# Patient Record
Sex: Male | Born: 1945
Health system: Southern US, Community
[De-identification: ages and names within clinical notes are randomized; demographics above are authoritative.]

---

## 2001-05-23 ENCOUNTER — Encounter: Admission: RE | Admit: 2001-05-23 | Discharge: 2001-05-23 | Payer: Self-pay | Admitting: Family Medicine

## 2001-05-23 ENCOUNTER — Encounter: Payer: Self-pay | Admitting: Family Medicine

## 2003-05-14 ENCOUNTER — Encounter: Admission: RE | Admit: 2003-05-14 | Discharge: 2003-05-14 | Payer: Self-pay | Admitting: Family Medicine

## 2003-11-29 ENCOUNTER — Encounter (INDEPENDENT_AMBULATORY_CARE_PROVIDER_SITE_OTHER): Payer: Self-pay | Admitting: *Deleted

## 2003-11-29 ENCOUNTER — Ambulatory Visit (HOSPITAL_COMMUNITY): Admission: RE | Admit: 2003-11-29 | Discharge: 2003-11-29 | Payer: Self-pay | Admitting: Urology

## 2004-01-31 ENCOUNTER — Ambulatory Visit: Payer: Self-pay | Admitting: Family Medicine

## 2004-08-09 ENCOUNTER — Ambulatory Visit: Payer: Self-pay | Admitting: Family Medicine

## 2004-08-15 ENCOUNTER — Ambulatory Visit (HOSPITAL_COMMUNITY): Admission: RE | Admit: 2004-08-15 | Discharge: 2004-08-15 | Payer: Self-pay | Admitting: Gastroenterology

## 2004-08-15 ENCOUNTER — Encounter (INDEPENDENT_AMBULATORY_CARE_PROVIDER_SITE_OTHER): Payer: Self-pay | Admitting: *Deleted

## 2004-09-13 ENCOUNTER — Ambulatory Visit: Payer: Self-pay | Admitting: Family Medicine

## 2004-09-20 ENCOUNTER — Ambulatory Visit: Payer: Self-pay | Admitting: Family Medicine

## 2005-04-23 ENCOUNTER — Ambulatory Visit: Payer: Self-pay | Admitting: Family Medicine

## 2006-11-28 DIAGNOSIS — Z87442 Personal history of urinary calculi: Secondary | ICD-10-CM | POA: Insufficient documentation

## 2008-04-20 ENCOUNTER — Encounter: Admission: RE | Admit: 2008-04-20 | Discharge: 2008-04-20 | Payer: Self-pay | Admitting: General Surgery

## 2008-04-22 ENCOUNTER — Ambulatory Visit (HOSPITAL_BASED_OUTPATIENT_CLINIC_OR_DEPARTMENT_OTHER): Admission: RE | Admit: 2008-04-22 | Discharge: 2008-04-22 | Payer: Self-pay | Admitting: General Surgery

## 2008-05-06 ENCOUNTER — Ambulatory Visit (HOSPITAL_BASED_OUTPATIENT_CLINIC_OR_DEPARTMENT_OTHER): Admission: RE | Admit: 2008-05-06 | Discharge: 2008-05-07 | Payer: Self-pay | Admitting: Orthopedic Surgery

## 2010-03-19 ENCOUNTER — Encounter: Payer: Self-pay | Admitting: Orthopedic Surgery

## 2010-05-18 ENCOUNTER — Other Ambulatory Visit: Payer: Self-pay | Admitting: Neurosurgery

## 2010-05-18 DIAGNOSIS — M542 Cervicalgia: Secondary | ICD-10-CM

## 2010-05-19 ENCOUNTER — Ambulatory Visit
Admission: RE | Admit: 2010-05-19 | Discharge: 2010-05-19 | Disposition: A | Discharge status: Home / Self Care | Payer: No Typology Code available for payment source | Source: Ambulatory Visit | Attending: Neurosurgery | Admitting: Neurosurgery

## 2010-05-19 DIAGNOSIS — M542 Cervicalgia: Secondary | ICD-10-CM

## 2010-06-13 LAB — COMPREHENSIVE METABOLIC PANEL
ALT: 15 U/L (ref 0–53)
AST: 20 U/L (ref 0–37)
Albumin: 4 g/dL (ref 3.5–5.2)
Alkaline Phosphatase: 49 U/L (ref 39–117)
Calcium: 9.6 mg/dL (ref 8.4–10.5)
GFR calc Af Amer: >60 mL/min (ref >60)
Glucose, Bld: 122 mg/dL — ABNORMAL HIGH (ref 70–99)
Potassium: 4 mEq/L (ref 3.5–5.1)
Sodium: 139 mEq/L (ref 135–145)
Total Protein: 6.5 g/dL (ref 6.0–8.3)

## 2010-06-13 LAB — CBC
HCT: 46.1 % (ref 39.0–52.0)
Hemoglobin: 16.3 g/dL (ref 13.0–17.0)
MCHC: 35.5 g/dL (ref 30.0–36.0)
MCV: 90.9 fL (ref 78.0–100.0)
Platelets: 188 10*3/uL (ref 150–400)
RBC: 5.07 MIL/uL (ref 4.22–5.81)
RDW: 12.7 % (ref 11.5–15.5)
WBC: 5.5 10*3/uL (ref 4.0–10.5)

## 2010-06-13 LAB — DIFFERENTIAL
Basophils Relative: 0 % (ref 0–1)
Eosinophils Absolute: 0.1 10*3/uL (ref 0.0–0.7)
Eosinophils Relative: 1 % (ref 0–5)
Lymphs Abs: 1.7 10*3/uL (ref 0.7–4.0)
Monocytes Absolute: 0.4 10*3/uL (ref 0.1–1.0)
Monocytes Relative: 6 % (ref 3–12)

## 2010-07-11 NOTE — Op Note (Signed)
NAMEJANIEL, CRISOSTOMO NO.:  [id number]   MEDICAL RECORD NO.:  [id number]          PATIENT TYPE:  AMB   LOCATION:  DSC                          FACILITY:  [facility]   PHYSICIAN:  Juanetta Gosling, MDDATE OF BIRTH:  09-28-1945   DATE OF PROCEDURE:  04/22/2008  DATE OF DISCHARGE:                               OPERATIVE REPORT   PREOPERATIVE DIAGNOSIS:  Recurrent right inguinal hernia.   POSTOPERATIVE DIAGNOSIS:  Indirect right inguinal hernia.   PROCEDURE:  Open right inguinal hernia with Ultrapro mesh patch.   SURGEON:  Troy Sine. Dwain Sarna, MD   ASSISTANT:  None.   ANESTHESIA:  General.   FINDINGS:  Indirect right inguinal hernia.   SPECIMENS:  None.   DRAINS:  None.   COMPLICATIONS:  None.   ESTIMATED BLOOD LOSS:  Minimal.   DISPOSITION:  To recovery room in stable condition.   HISTORY:  Mr. Arko is a 65 year old male with about a 81-month history  of right groin pain and a bulge.  This has been increasingly painful to  him and has been popping out quite frequently.  This has been reducible  at all times, but it is certainly getting worse over time on his exam.  On exam he had a healed right groin incision from a pediatric right  inguinal hernia repair as well as a reducible right inguinal that was  nontender. He and I discussed open right inguinal hernia repair with  mesh.   PROCEDURE:  After informed consent was obtained, the patient was taken  to the operating room.  He had 1 g of intravenous cefazolin  administered.  Sequential compression devices were placed on his lower  extremities prior to operation.  He was placed under general anesthesia  without complication.  His right groin was then prepped and draped in a  standard sterile surgical fashion.  A surgical time-out was then  performed.   His prior right groin incision was then entered.  Dissection was carried  out down with electrocautery to the level of external abdominal oblique.  There was some scarring to his abdominal oblique requiring this to be  mobilized from his skin.  I then entered his external abdominal oblique  through the external ring, which had been mostly obliterated with his  prior hernia repair.  This space was then developed.  The spermatic cord  was then identified and encircled with a Penrose drain.  He had a fairly  sizeable indirect hernia that was dissected free from the surrounding  cord structures.  I then excised the hernia sac and reduced this into  the abdomen.  After creating most space, an Ultrapro mesh patch was then  laid over the floor.  This was sutured into position with a 2-0 Prolene  at the pubic tubercle, but not into pubic tubercle, in 2 positions.  This was sutured at the inguinal ligament in 2 positions as well as to  the internal abdominal oblique in 2 positions.  A T-cut was made in the  mesh and the mesh was wrapped around the spermatic cord.  These ends  were  then tacked  together as well as to the shelving edge with the 2-0  Prolene.  The ends were then laid flat underneath the external abdominal  oblique.  The mesh appeared in good position.  Hemostasis was observed.  The Penrose drain was removed.  The external abdominal oblique was then  closed with a 2-0 Vicryl.  The soft tissue was then closed with another  2-0 Vicryl.  The Scarpa's was closed with a 3-0 Vicryl and the skin was  closed with 4-0 Monocryl in a subcuticular fashion.  Steri-Strips and  sterile dressing were placed over this.  A 10 mL of 0.25% Marcaine  infiltrated around the wound as well as performing an ilioinguinal nerve  block.  Sterile dressing was then placed overlying this.  He tolerated  this well and was transferred to the recovery room in stable condition.      Juanetta Gosling, MD  Electronically Signed     MCW/MEDQ  D:  04/22/2008  T:  04/23/2008  Job:  [id number]   cc:   Lynelle Smoke I. Patsi Sears, M.D.

## 2010-07-11 NOTE — Op Note (Signed)
Sean Norris, Sean Norris               ACCOUNT NO.:  [id number]   MEDICAL RECORD NO.:  [id number]          PATIENT TYPE:  AMB   LOCATION:  DSC                          FACILITY:  [facility]   PHYSICIAN:  Katy Fitch. Sypher, M.D. DATE OF BIRTH:  Jan 11, 1946   DATE OF PROCEDURE:  05/06/2008  DATE OF DISCHARGE:                               OPERATIVE REPORT   PREOPERATIVE DIAGNOSES:  Chronic stage-III impingement, right shoulder  with MRI evidence of a partial-thickness articular side rotator cuff  tear, biceps tendinopathy, labral pathology, and unfavorable  acromioclavicular anatomy with a prominent medial acromion.   POSTOPERATIVE DIAGNOSES:  A 25% biceps tear at entrance to  intertubercular groove, chronic synovitis, chronic type-I  degenerative/labral degenerative tear, acromioclavicular degenerative  arthritis with prominent inferior projecting osteophytes and an 85%  bursal surface tear of the supraspinatus that was retracted  approximately 2 cm.   OPERATIONS:  1. Diagnostic arthroscopy, right shoulder.  2. Arthroscopic debridement of biceps, labrum, and synovitis.  3. Arthroscopic debridement of deep surface of rotator cuff primarily      supraspinatus.  4. Subacromial decompression with bursectomy, coracoacromial ligament      relaxation and bursectomy.  5. Arthroscopic distal clavicle resection.  6. Tuberosity plasty followed by arthroscopic repair of supraspinatus      tendon utilizing a Arthrex fiber tape and a swivel lock laterally.   OPERATING SURGEON:  Katy Fitch. Sypher, MD   ASSISTANT:  Marveen Reeks Dasnoit, PA-C   ANESTHESIA:  General endotracheal supplemented by a right interscalene  block.   SUPERVISING ANESTHESIOLOGIST:  Maren Beach, MD   INDICATIONS:  Sean Norris is a 65 year old gentleman referred through  the courtesy of Dr. Maurice Small and Dr. Pati Gallo of [facility] for evaluation and management of a painful right shoulder.  Clinical  examination suggested a degenerative rotator cuff tear with AC  arthropathy.  He likely has stage-III impingement.   We recommended that he consider diagnostic arthroscopy, subacromial  decompression, rotator cuff debridement, debridement as arthroscopy  findings dictated and we suggested preoperatively that he had a  significant chance of requiring rotator cuff reconstruction.   Preoperative questions were invited and answered in detail both in the  office and in the holding area.   Sean Norris is brought to the operating room at this time anticipating  arthroscopic decompression, debridement, and possible repair of the  rotator cuff.   PROCEDURE:  Sean Norris was brought to the operating room and placed  in supine position on the operating table.  Following an anesthesia  consult with Dr. Katrinka Blazing in the holding area, general anesthesia by  endotracheal technique was recommended and accepted.  Dr. Katrinka Blazing also  provided informed consent and placed a right interscalene block without  complication.   Sean Norris was brought to room #8 of the [facility], placed in  supine position on the operating table.  Under Dr. Michaelle Copas direct  supervision, general endotracheal anesthesia induced.   He was carefully positioned in beach-chair position with aid of a torso  and head holder designed for arthroscopy.  A 1 g  of Ancef was  administered as an IV prophylactic antibiotic.   The entire right extremity forequarter prepped with DuraPrep and draped  with impervious arthroscopy drapes.  The scope was introduced through a  standard posterior viewing portal with an anterior switching stick.  Diagnostic arthroscopy revealed grade II and III chondromalacia of the  humeral head anteriorly.  There was a 25% biceps tear that was  degenerative at the intertubercular groove.  The labrum had degenerative  type I changes.  An anterior portal was created under direct vision  followed by use of  suction shaver to debride the biceps, labrum, and  synovitis.  Deep surface of the cuff was debrided and no full-thickness  tear was identified.  The scope was then placed in subacromial space.  There was florid bursitis.  After bursectomy, the coracoacromial  ligament was partially relaxed.  The acromion was leveled to a type 1  morphology and complete bursectomy was accomplished.  The [facility] joint  capsule was violated.  There were significant inferior projecting  osteophytes from medial acromion and distal clavicle.  The distal 15-mm  of the clavicle was removed arthroscopically with hemostasis being  achieved with bipolar cautery.  The infraspinatus tendon was intact.  Teres minor was intact.  Subscapularis tendon had minimal degenerative  changes, grade I.  The bursal side of the supraspinatus had a  degenerative ulcerated tear that was about 85% of the thickness of the  tendon and retracted about 15 mm.   A suction bur was used to decorticate the exposed tuberosity and perform  a tuberosity plasty lowering the profile of the tuberosity about 3 mm.   An arthroscopic periosteal elevator was used to create tracks to the  supraspinatus tendon and a Scorpion suture passer was used to pass a  mattress suture of fiber tape, which allowed creation of a horizontal  mattress suture to a lateral swivel lock anatomically replacing the  supraspinatus footprint.  Satisfactory contour was achieved.   The subacromial space was then inspected for hemostasis followed by  removal of the arthroscopic equipment.  Photographic documentation of  chondromalacia, labral degenerative changes, biceps tear, and rotator  cuff tear was accomplished with a digital camera.   There were no apparent complications.   Sean Norris tolerated the surgery and anesthesia well.  He was placed in  a sling, transferred to the recovery room with stable signs.  The  portals were repaired with 3-0 Prolene.  For aftercare, he  will be  admitted to recovery care center for observation of his vital signs and  appropriate analgesics from p.o. and IV Dilaudid and p.o. Percocet.  He  will also be provided Ancef 1 g IV q.8 h. x3 doses.      Katy Fitch Sypher, M.D.  Electronically Signed     RVS/MEDQ  D:  05/06/2008  T:  05/07/2008  Job:  [id number]   cc:   Gretta Arab. Valentina Lucks, M.D.  Estell Harpin, M.D.

## 2010-07-14 NOTE — Op Note (Signed)
NAMENAZAIR, FORTENBERRY               ACCOUNT NO.:  [id number]   MEDICAL RECORD NO.:  [id number]          PATIENT TYPE:  AMB   LOCATION:  ENDO                         FACILITY:  [facility]   PHYSICIAN:  Petra Kuba, M.D.    DATE OF BIRTH:  1945-09-02   DATE OF PROCEDURE:  08/15/2004  DATE OF DISCHARGE:                                 OPERATIVE REPORT   PROCEDURE:  Colonoscopy with biopsy.   INDICATION:  Bright red blood per rectum, family history of colon polyps,  due for colonic screening. Consent was signed after risks, benefits,  methods, options were thoroughly discussed in the office.   MEDICINES USED:  Demerol 80, Versed 8.   PROCEDURE:  Rectal inspection pertinent for external hemorrhoids, small.  Digital exam was negative. Video pediatric adjustable colonoscope was  inserted, easily advanced around the colon to the cecum. No position changes  or abdominal pressure was needed.  The cecum was identified by the  appendiceal orifice and ileocecal valve. In fact, the scope was inserted a  short ways in the terminal ileum which was normal.  Photodocumentation was  obtained. Scope was slowly withdrawn. The prep was adequate. There is some  liquid stool that required washing and suctioning on slow withdrawal through  the colon cecum. The cecum, ascending, transverse, descending and sigmoid  were all normal without signs of diverticula, polyps or masses. Once in the  rectum two tiny probable hyperplastic appearing polyps were seen.  These  were cold biopsied times one and put in the same container. Anorectal pull-  through and retroflexion confirmed some small hemorrhoids. Scope was  straightened and readvanced short ways up the left side of the colon.  Air  was suctioned, scope removed. The patient tolerated the procedure well.  There was no obvious immediate complication.   ENDOSCOPIC DIAGNOSES:  1.  Internal-external hemorrhoids.  2.  Two tiny rectal polyps cold biopsied. Probably  hyperplastic.  3.  Otherwise within normal limits to the terminal ileum.   PLAN:  Await pathology. Probably recheck colon screening in five years.  Happy to see back p.r.n. otherwise return care to Dr. Clent Ridges for the customary  health care maintenance to include yearly rectals and guaiacs.       MEM/MEDQ  D:  08/15/2004  T:  08/15/2004  Job:  [id number]   cc:   Jeannett Senior A. Clent Ridges, M.D. [facility]

## 2010-07-14 NOTE — Op Note (Signed)
NAME:  Sean Norris, Sean Norris               ACCOUNT NO.:  [id number]   MEDICAL RECORD NO.:  [id number]          PATIENT TYPE:  AMB   LOCATION:  DAY                          [facility]ACILITY:  [facility]   PHYSICIAN:  Sigmund I. Tannenbaum, M.D.DATE OF BIRTH:  [birth year]   DATE OF PROCEDURE:  11/29/2003  DATE OF DISCHARGE:                                 OPERATIVE REPORT   PREOPERATIVE DIAGNOSIS:  Large left epididymal cyst.   POSTOPERATIVE DIAGNOSIS:  Large left epididymal cyst.   OPERATION:  Scrotal exploration and extrication of large left epididymal  cyst ( 8 cm x 8 cm).   SURGEON:  Sigmund I. Patsi Sears, M.D.   ASSISTANT:  Terri Piedra, N.P.   PREPARATION:  After appropriate preanesthesia, the patient is brought to the  operating room and placed on the operating room table in the dorsal supine  position.  There general LMA anesthesia was introduced.  He remained in the  supine position, where the scrotum was prepped with Betadine solution and  draped in the usual fashion.   DESCRIPTION OF PROCEDURE:  Left scrotal incision was made and subcutaneous  tissue was dissected with electrosurgery.  A very large, left cystic mass  was identified attached to the testicle.  Careful sharp and blunt dissection  was accomplished.  Both the testicle and the large cyst were delivered into  the wound.  Careful dissection of both anterior, posteriorly, superiorly and  inferiorly was accomplished, and the cyst was dissected free from the  stretched spermatic vessel.  The vas was identified, the spermatic vessels  were identified and left intact.   The cyst was removed intact, and sent to the laboratory for evaluation.  No  bleeding was noted.  The testicle was delivered in the wound.  A cord block  was effected with 0.5 plain Marcaine, and the wound was also anesthetized  with 0.5 plain Marcaine.  The wound was then closed in three layers with 3-0  Vicryl suture.  The patient tolerated the procedure well, and was  awakened  and taken to the recovery room in good condition.      SIT/MEDQ  D:  11/29/2003  T:  11/29/2003  Job:  [id number]

## 2010-11-09 ENCOUNTER — Other Ambulatory Visit (HOSPITAL_COMMUNITY): Payer: Self-pay | Admitting: Neurosurgery

## 2010-11-09 DIAGNOSIS — M5412 Radiculopathy, cervical region: Secondary | ICD-10-CM

## 2010-12-12 ENCOUNTER — Other Ambulatory Visit (HOSPITAL_COMMUNITY): Payer: No Typology Code available for payment source

## 2011-01-30 ENCOUNTER — Other Ambulatory Visit (HOSPITAL_COMMUNITY): Payer: No Typology Code available for payment source

## 2011-09-06 DIAGNOSIS — R634 Abnormal weight loss: Secondary | ICD-10-CM | POA: Diagnosis not present

## 2011-09-06 DIAGNOSIS — Z1211 Encounter for screening for malignant neoplasm of colon: Secondary | ICD-10-CM | POA: Diagnosis not present

## 2011-09-06 DIAGNOSIS — K625 Hemorrhage of anus and rectum: Secondary | ICD-10-CM | POA: Diagnosis not present

## 2011-09-06 DIAGNOSIS — R0989 Other specified symptoms and signs involving the circulatory and respiratory systems: Secondary | ICD-10-CM | POA: Diagnosis not present

## 2011-09-06 DIAGNOSIS — R5381 Other malaise: Secondary | ICD-10-CM | POA: Diagnosis not present

## 2011-09-06 DIAGNOSIS — R5383 Other fatigue: Secondary | ICD-10-CM | POA: Diagnosis not present

## 2011-09-10 DIAGNOSIS — R634 Abnormal weight loss: Secondary | ICD-10-CM | POA: Diagnosis not present

## 2011-09-10 DIAGNOSIS — K921 Melena: Secondary | ICD-10-CM | POA: Diagnosis not present

## 2011-09-10 DIAGNOSIS — Z1211 Encounter for screening for malignant neoplasm of colon: Secondary | ICD-10-CM | POA: Diagnosis not present

## 2011-09-18 DIAGNOSIS — L570 Actinic keratosis: Secondary | ICD-10-CM | POA: Diagnosis not present

## 2011-10-19 ENCOUNTER — Other Ambulatory Visit: Payer: Self-pay | Admitting: Gastroenterology

## 2011-10-19 DIAGNOSIS — K921 Melena: Secondary | ICD-10-CM | POA: Diagnosis not present

## 2011-10-19 DIAGNOSIS — K5730 Diverticulosis of large intestine without perforation or abscess without bleeding: Secondary | ICD-10-CM | POA: Diagnosis not present

## 2011-10-19 DIAGNOSIS — D129 Benign neoplasm of anus and anal canal: Secondary | ICD-10-CM | POA: Diagnosis not present

## 2011-10-19 DIAGNOSIS — D126 Benign neoplasm of colon, unspecified: Secondary | ICD-10-CM | POA: Diagnosis not present

## 2011-10-19 DIAGNOSIS — R634 Abnormal weight loss: Secondary | ICD-10-CM | POA: Diagnosis not present

## 2011-10-19 DIAGNOSIS — D128 Benign neoplasm of rectum: Secondary | ICD-10-CM | POA: Diagnosis not present

## 2012-02-08 DIAGNOSIS — Z23 Encounter for immunization: Secondary | ICD-10-CM | POA: Diagnosis not present

## 2012-03-03 DIAGNOSIS — H02139 Senile ectropion of unspecified eye, unspecified eyelid: Secondary | ICD-10-CM | POA: Diagnosis not present

## 2012-04-01 ENCOUNTER — Other Ambulatory Visit: Payer: Self-pay | Admitting: Dermatology

## 2012-04-01 DIAGNOSIS — D485 Neoplasm of uncertain behavior of skin: Secondary | ICD-10-CM | POA: Diagnosis not present

## 2012-04-01 DIAGNOSIS — D044 Carcinoma in situ of skin of scalp and neck: Secondary | ICD-10-CM | POA: Diagnosis not present

## 2012-04-01 DIAGNOSIS — Z85828 Personal history of other malignant neoplasm of skin: Secondary | ICD-10-CM | POA: Diagnosis not present

## 2012-04-01 DIAGNOSIS — L570 Actinic keratosis: Secondary | ICD-10-CM | POA: Diagnosis not present

## 2012-04-08 DIAGNOSIS — H2510 Age-related nuclear cataract, unspecified eye: Secondary | ICD-10-CM | POA: Diagnosis not present

## 2012-05-13 DIAGNOSIS — H2510 Age-related nuclear cataract, unspecified eye: Secondary | ICD-10-CM | POA: Diagnosis not present

## 2012-05-19 DIAGNOSIS — H02839 Dermatochalasis of unspecified eye, unspecified eyelid: Secondary | ICD-10-CM | POA: Diagnosis not present

## 2012-05-19 DIAGNOSIS — H11439 Conjunctival hyperemia, unspecified eye: Secondary | ICD-10-CM | POA: Diagnosis not present

## 2012-05-19 DIAGNOSIS — H04229 Epiphora due to insufficient drainage, unspecified lacrimal gland: Secondary | ICD-10-CM | POA: Diagnosis not present

## 2012-05-19 DIAGNOSIS — M2420 Disorder of ligament, unspecified site: Secondary | ICD-10-CM | POA: Diagnosis not present

## 2012-05-19 DIAGNOSIS — H02139 Senile ectropion of unspecified eye, unspecified eyelid: Secondary | ICD-10-CM | POA: Diagnosis not present

## 2012-05-19 DIAGNOSIS — H02429 Myogenic ptosis of unspecified eyelid: Secondary | ICD-10-CM | POA: Diagnosis not present

## 2012-06-23 DIAGNOSIS — M2420 Disorder of ligament, unspecified site: Secondary | ICD-10-CM | POA: Diagnosis not present

## 2012-06-23 DIAGNOSIS — H04529 Eversion of unspecified lacrimal punctum: Secondary | ICD-10-CM | POA: Diagnosis not present

## 2012-06-23 DIAGNOSIS — H02139 Senile ectropion of unspecified eye, unspecified eyelid: Secondary | ICD-10-CM | POA: Diagnosis not present

## 2012-06-23 DIAGNOSIS — H11439 Conjunctival hyperemia, unspecified eye: Secondary | ICD-10-CM | POA: Diagnosis not present

## 2012-06-23 DIAGNOSIS — H04229 Epiphora due to insufficient drainage, unspecified lacrimal gland: Secondary | ICD-10-CM | POA: Diagnosis not present

## 2012-07-07 DIAGNOSIS — H52209 Unspecified astigmatism, unspecified eye: Secondary | ICD-10-CM | POA: Diagnosis not present

## 2012-07-07 DIAGNOSIS — H2510 Age-related nuclear cataract, unspecified eye: Secondary | ICD-10-CM | POA: Diagnosis not present

## 2012-07-07 DIAGNOSIS — H269 Unspecified cataract: Secondary | ICD-10-CM | POA: Diagnosis not present

## 2012-07-08 DIAGNOSIS — H2510 Age-related nuclear cataract, unspecified eye: Secondary | ICD-10-CM | POA: Diagnosis not present

## 2012-07-28 DIAGNOSIS — H11009 Unspecified pterygium of unspecified eye: Secondary | ICD-10-CM | POA: Diagnosis not present

## 2012-07-28 DIAGNOSIS — H52209 Unspecified astigmatism, unspecified eye: Secondary | ICD-10-CM | POA: Diagnosis not present

## 2012-07-28 DIAGNOSIS — H2510 Age-related nuclear cataract, unspecified eye: Secondary | ICD-10-CM | POA: Diagnosis not present

## 2012-07-28 DIAGNOSIS — H269 Unspecified cataract: Secondary | ICD-10-CM | POA: Diagnosis not present

## 2012-07-29 ENCOUNTER — Other Ambulatory Visit: Payer: Self-pay | Admitting: Dermatology

## 2012-07-29 DIAGNOSIS — D485 Neoplasm of uncertain behavior of skin: Secondary | ICD-10-CM | POA: Diagnosis not present

## 2012-07-29 DIAGNOSIS — L723 Sebaceous cyst: Secondary | ICD-10-CM | POA: Diagnosis not present

## 2012-07-29 DIAGNOSIS — L7211 Pilar cyst: Secondary | ICD-10-CM | POA: Diagnosis not present

## 2012-07-29 DIAGNOSIS — Z85828 Personal history of other malignant neoplasm of skin: Secondary | ICD-10-CM | POA: Diagnosis not present

## 2012-07-29 DIAGNOSIS — L570 Actinic keratosis: Secondary | ICD-10-CM | POA: Diagnosis not present

## 2012-08-05 DIAGNOSIS — L080 Pyoderma: Secondary | ICD-10-CM | POA: Diagnosis not present

## 2012-09-15 DIAGNOSIS — H11439 Conjunctival hyperemia, unspecified eye: Secondary | ICD-10-CM | POA: Diagnosis not present

## 2012-09-15 DIAGNOSIS — H02139 Senile ectropion of unspecified eye, unspecified eyelid: Secondary | ICD-10-CM | POA: Diagnosis not present

## 2012-09-15 DIAGNOSIS — H02419 Mechanical ptosis of unspecified eyelid: Secondary | ICD-10-CM | POA: Diagnosis not present

## 2012-09-15 DIAGNOSIS — M2420 Disorder of ligament, unspecified site: Secondary | ICD-10-CM | POA: Diagnosis not present

## 2012-11-01 DIAGNOSIS — S0100XA Unspecified open wound of scalp, initial encounter: Secondary | ICD-10-CM | POA: Diagnosis not present

## 2012-11-12 ENCOUNTER — Other Ambulatory Visit: Payer: Self-pay | Admitting: Dermatology

## 2012-11-12 DIAGNOSIS — D485 Neoplasm of uncertain behavior of skin: Secondary | ICD-10-CM | POA: Diagnosis not present

## 2012-11-12 DIAGNOSIS — C4359 Malignant melanoma of other part of trunk: Secondary | ICD-10-CM | POA: Diagnosis not present

## 2012-11-12 DIAGNOSIS — L570 Actinic keratosis: Secondary | ICD-10-CM | POA: Diagnosis not present

## 2012-11-12 DIAGNOSIS — Z85828 Personal history of other malignant neoplasm of skin: Secondary | ICD-10-CM | POA: Diagnosis not present

## 2012-11-12 DIAGNOSIS — C44319 Basal cell carcinoma of skin of other parts of face: Secondary | ICD-10-CM | POA: Diagnosis not present

## 2012-11-20 ENCOUNTER — Other Ambulatory Visit: Payer: Self-pay | Admitting: Dermatology

## 2012-11-20 DIAGNOSIS — Z85828 Personal history of other malignant neoplasm of skin: Secondary | ICD-10-CM | POA: Diagnosis not present

## 2012-11-20 DIAGNOSIS — C4359 Malignant melanoma of other part of trunk: Secondary | ICD-10-CM | POA: Diagnosis not present

## 2012-12-12 DIAGNOSIS — Z23 Encounter for immunization: Secondary | ICD-10-CM | POA: Diagnosis not present

## 2012-12-22 ENCOUNTER — Other Ambulatory Visit: Payer: Self-pay | Admitting: Ophthalmology

## 2012-12-22 DIAGNOSIS — H02109 Unspecified ectropion of unspecified eye, unspecified eyelid: Secondary | ICD-10-CM | POA: Diagnosis not present

## 2012-12-22 DIAGNOSIS — L923 Foreign body granuloma of the skin and subcutaneous tissue: Secondary | ICD-10-CM | POA: Diagnosis not present

## 2012-12-22 DIAGNOSIS — D210 Benign neoplasm of connective and other soft tissue of head, face and neck: Secondary | ICD-10-CM | POA: Diagnosis not present

## 2012-12-22 DIAGNOSIS — H0289 Other specified disorders of eyelid: Secondary | ICD-10-CM | POA: Diagnosis not present

## 2012-12-22 DIAGNOSIS — M2420 Disorder of ligament, unspecified site: Secondary | ICD-10-CM | POA: Diagnosis not present

## 2012-12-22 DIAGNOSIS — H02139 Senile ectropion of unspecified eye, unspecified eyelid: Secondary | ICD-10-CM | POA: Diagnosis not present

## 2013-04-22 ENCOUNTER — Other Ambulatory Visit: Payer: Self-pay | Admitting: Dermatology

## 2013-04-22 DIAGNOSIS — L570 Actinic keratosis: Secondary | ICD-10-CM | POA: Diagnosis not present

## 2013-04-22 DIAGNOSIS — D485 Neoplasm of uncertain behavior of skin: Secondary | ICD-10-CM | POA: Diagnosis not present

## 2013-04-22 DIAGNOSIS — L578 Other skin changes due to chronic exposure to nonionizing radiation: Secondary | ICD-10-CM | POA: Diagnosis not present

## 2013-04-22 DIAGNOSIS — C44621 Squamous cell carcinoma of skin of unspecified upper limb, including shoulder: Secondary | ICD-10-CM | POA: Diagnosis not present

## 2013-04-22 DIAGNOSIS — L821 Other seborrheic keratosis: Secondary | ICD-10-CM | POA: Diagnosis not present

## 2013-04-22 DIAGNOSIS — Z85828 Personal history of other malignant neoplasm of skin: Secondary | ICD-10-CM | POA: Diagnosis not present

## 2013-04-22 DIAGNOSIS — Z85820 Personal history of malignant melanoma of skin: Secondary | ICD-10-CM | POA: Diagnosis not present

## 2013-05-04 DIAGNOSIS — H02839 Dermatochalasis of unspecified eye, unspecified eyelid: Secondary | ICD-10-CM | POA: Diagnosis not present

## 2013-05-04 DIAGNOSIS — H02419 Mechanical ptosis of unspecified eyelid: Secondary | ICD-10-CM | POA: Diagnosis not present

## 2013-05-04 DIAGNOSIS — H02409 Unspecified ptosis of unspecified eyelid: Secondary | ICD-10-CM | POA: Diagnosis not present

## 2013-08-03 DIAGNOSIS — H11009 Unspecified pterygium of unspecified eye: Secondary | ICD-10-CM | POA: Diagnosis not present

## 2013-08-03 DIAGNOSIS — H5200 Hypermetropia, unspecified eye: Secondary | ICD-10-CM | POA: Diagnosis not present

## 2013-11-05 DIAGNOSIS — Z23 Encounter for immunization: Secondary | ICD-10-CM | POA: Diagnosis not present

## 2013-11-25 DIAGNOSIS — C4491 Basal cell carcinoma of skin, unspecified: Secondary | ICD-10-CM | POA: Diagnosis not present

## 2013-11-25 DIAGNOSIS — Z131 Encounter for screening for diabetes mellitus: Secondary | ICD-10-CM | POA: Diagnosis not present

## 2013-11-25 DIAGNOSIS — Z23 Encounter for immunization: Secondary | ICD-10-CM | POA: Diagnosis not present

## 2013-11-25 DIAGNOSIS — C439 Malignant melanoma of skin, unspecified: Secondary | ICD-10-CM | POA: Diagnosis not present

## 2013-11-25 DIAGNOSIS — Z0000 Encounter for general adult medical examination without abnormal findings: Secondary | ICD-10-CM | POA: Diagnosis not present

## 2013-11-25 DIAGNOSIS — R030 Elevated blood-pressure reading, without diagnosis of hypertension: Secondary | ICD-10-CM | POA: Diagnosis not present

## 2013-11-25 DIAGNOSIS — Z136 Encounter for screening for cardiovascular disorders: Secondary | ICD-10-CM | POA: Diagnosis not present

## 2013-11-25 DIAGNOSIS — Z125 Encounter for screening for malignant neoplasm of prostate: Secondary | ICD-10-CM | POA: Diagnosis not present

## 2013-12-17 ENCOUNTER — Other Ambulatory Visit: Payer: Self-pay | Admitting: Dermatology

## 2013-12-17 DIAGNOSIS — Z85828 Personal history of other malignant neoplasm of skin: Secondary | ICD-10-CM | POA: Diagnosis not present

## 2013-12-17 DIAGNOSIS — L821 Other seborrheic keratosis: Secondary | ICD-10-CM | POA: Diagnosis not present

## 2013-12-17 DIAGNOSIS — D044 Carcinoma in situ of skin of scalp and neck: Secondary | ICD-10-CM | POA: Diagnosis not present

## 2013-12-17 DIAGNOSIS — C44622 Squamous cell carcinoma of skin of right upper limb, including shoulder: Secondary | ICD-10-CM | POA: Diagnosis not present

## 2013-12-17 DIAGNOSIS — D485 Neoplasm of uncertain behavior of skin: Secondary | ICD-10-CM | POA: Diagnosis not present

## 2013-12-17 DIAGNOSIS — L570 Actinic keratosis: Secondary | ICD-10-CM | POA: Diagnosis not present

## 2013-12-17 DIAGNOSIS — Z85820 Personal history of malignant melanoma of skin: Secondary | ICD-10-CM | POA: Diagnosis not present

## 2014-06-22 ENCOUNTER — Other Ambulatory Visit: Payer: Self-pay | Admitting: Dermatology

## 2015-06-20 DIAGNOSIS — Z125 Encounter for screening for malignant neoplasm of prostate: Secondary | ICD-10-CM | POA: Diagnosis not present

## 2015-06-20 DIAGNOSIS — E7800 Pure hypercholesterolemia, unspecified: Secondary | ICD-10-CM | POA: Diagnosis not present

## 2015-06-28 DIAGNOSIS — H524 Presbyopia: Secondary | ICD-10-CM | POA: Diagnosis not present

## 2015-06-29 DIAGNOSIS — Z85828 Personal history of other malignant neoplasm of skin: Secondary | ICD-10-CM | POA: Diagnosis not present

## 2015-06-29 DIAGNOSIS — L570 Actinic keratosis: Secondary | ICD-10-CM | POA: Diagnosis not present

## 2015-06-29 DIAGNOSIS — Z85820 Personal history of malignant melanoma of skin: Secondary | ICD-10-CM | POA: Diagnosis not present

## 2015-06-29 DIAGNOSIS — L821 Other seborrheic keratosis: Secondary | ICD-10-CM | POA: Diagnosis not present

## 2015-08-22 DIAGNOSIS — R0981 Nasal congestion: Secondary | ICD-10-CM | POA: Diagnosis not present

## 2015-08-22 DIAGNOSIS — B349 Viral infection, unspecified: Secondary | ICD-10-CM | POA: Diagnosis not present

## 2015-12-14 DIAGNOSIS — R251 Tremor, unspecified: Secondary | ICD-10-CM | POA: Diagnosis not present

## 2015-12-14 DIAGNOSIS — Z8042 Family history of malignant neoplasm of prostate: Secondary | ICD-10-CM | POA: Diagnosis not present

## 2015-12-14 DIAGNOSIS — C4491 Basal cell carcinoma of skin, unspecified: Secondary | ICD-10-CM | POA: Diagnosis not present

## 2015-12-14 DIAGNOSIS — Z125 Encounter for screening for malignant neoplasm of prostate: Secondary | ICD-10-CM | POA: Diagnosis not present

## 2015-12-14 DIAGNOSIS — E7800 Pure hypercholesterolemia, unspecified: Secondary | ICD-10-CM | POA: Diagnosis not present

## 2015-12-14 DIAGNOSIS — G479 Sleep disorder, unspecified: Secondary | ICD-10-CM | POA: Diagnosis not present

## 2015-12-14 DIAGNOSIS — Z0000 Encounter for general adult medical examination without abnormal findings: Secondary | ICD-10-CM | POA: Diagnosis not present

## 2015-12-14 DIAGNOSIS — M1990 Unspecified osteoarthritis, unspecified site: Secondary | ICD-10-CM | POA: Diagnosis not present

## 2016-01-03 DIAGNOSIS — Z85820 Personal history of malignant melanoma of skin: Secondary | ICD-10-CM | POA: Diagnosis not present

## 2016-01-03 DIAGNOSIS — L570 Actinic keratosis: Secondary | ICD-10-CM | POA: Diagnosis not present

## 2016-01-03 DIAGNOSIS — Z85828 Personal history of other malignant neoplasm of skin: Secondary | ICD-10-CM | POA: Diagnosis not present

## 2016-01-03 DIAGNOSIS — L821 Other seborrheic keratosis: Secondary | ICD-10-CM | POA: Diagnosis not present

## 2016-01-09 ENCOUNTER — Ambulatory Visit (INDEPENDENT_AMBULATORY_CARE_PROVIDER_SITE_OTHER): Payer: PPO

## 2016-01-09 ENCOUNTER — Ambulatory Visit (INDEPENDENT_AMBULATORY_CARE_PROVIDER_SITE_OTHER): Payer: PPO | Admitting: Orthopaedic Surgery

## 2016-01-09 DIAGNOSIS — M25562 Pain in left knee: Secondary | ICD-10-CM | POA: Diagnosis not present

## 2016-01-09 DIAGNOSIS — G8929 Other chronic pain: Secondary | ICD-10-CM

## 2016-01-09 DIAGNOSIS — M25551 Pain in right hip: Secondary | ICD-10-CM

## 2016-01-09 MED ORDER — MELOXICAM 7.5 MG PO TABS: 7.5000 mg | ORAL_TABLET | Freq: Two times a day (BID) | ORAL | Status: AC | PRN

## 2016-01-09 MED ORDER — METHYLPREDNISOLONE 4 MG PO TABS: ORAL_TABLET | ORAL | 0 refills | Status: AC

## 2016-01-09 NOTE — Progress Notes (Signed)
Office Visit Note   Patient: Sean Norris           Date of Birth: December 19, 1945           MRN: [id number] Visit Date: 01/09/2016              Requested by: Kelton Pillar, MD [address] [place], [place] [address] PCP: Osborne Casco, MD   Assessment & Plan: Visit Diagnoses:  1. Pain in right hip   2. Chronic pain of left knee     Plan: I did show Sean Norris quad strengthening exercises to try. We'll put him on a six-day steroid taper followed by meloxicam. I'll see him back in a month to see how is doing overall. If he still having the mechanical symptoms in his left knee of locking and catching incident MRI of that knee. We may even consider injections over his right hip trochanteric area for trochanter bursitis and in his left elbow joint.  Follow-Up Instructions: Return in about 4 weeks (around 02/06/2016).   Orders:  Orders Placed This Encounter  Procedures  . XR HIP UNILAT W OR W/O PELVIS 1V RIGHT  . XR Knee 1-2 Views Left   Meds ordered this encounter  Medications  . methylPREDNISolone (MEDROL) 4 MG tablet    Sig: Medrol dose pack. Take as instructed    Dispense:  21 tablet    Refill:  0  . meloxicam (MOBIC) tablet 7.5 mg      Procedures: No procedures performed   Clinical Data: No additional findings.   Subjective: No chief complaint on file. Sean Norris is a very pleasant 70 year old gentleman who comes in with chief complaint of right hip pain and he points over the trochanteric areas source of his pain, left elbow pain which is deep in the elbow joint where he points to, and left knee pain and he points the medial side of his knees source of that pain. He says it the knee will occasionally lock on him and he'll have a sharp stabbing type of pain and was sent him to the ground. It doesn't occur daily and it but it does occur several times a year. He has the same issues with the right hip when it occurs on occasion. He's had a history of  lateral epicondylitis of left elbow but he said this is different and feels more like it in the joint. He is otherwise a healthy individual only takes a statin medication and does not take any other medications at all.  HPI  Review of Systems Negative for headache, chest pain, shortness breath, nausea, vomiting, fever, chills.  Objective: Vital Signs: There were no vitals taken for this visit.  Physical Exam He is alert and oriented 3 in no acute distress Ortho Exam Examination of his right hip shows fluid range of motion of the hip with no pain in the groin. He has only some tenderness over trochanteric area. His left knee has full range of motion with no effusion. His Lachman's and McMurray's exams are negative. He has slight medial joint line tenderness. Examination of his left elbow shows no effusion. There is pain deep at the joint on them lateral aspect but no pain over the lateral epicondyle Specialty Comments:  No specialty comments available.  Imaging: Xr Hip Unilat W Or W/o Pelvis 1v Right  Result Date: 01/09/2016 An AP pelvis and lateral of his right hip shows a well maintained joint space on both hips. I see  no cortical irregularities and no significant arthritic changes. No evidence of fracture.  Xr Knee 1-2 Views Left  Result Date: 01/09/2016 An AP and lateral left knee show show shows AP of the right knee. The joint spaces are very well maintained with no effusion or bony irregularities or malalignment issues. There is no evidence of fracture. There is no effusion.    PMFS History: Patient Active Problem List   Diagnosis Date Noted  . NEPHROLITHIASIS, HX OF 11/28/2006   No past medical history on file.  No family history on file.  No past surgical history on file. Social History   Occupational History  . Not on file.   Social History Main Topics  . Smoking status: Not on file  . Smokeless tobacco: Not on file  . Alcohol use Not on file  . Drug use:  Unknown  . Sexual activity: Not on file

## 2016-02-07 ENCOUNTER — Ambulatory Visit (INDEPENDENT_AMBULATORY_CARE_PROVIDER_SITE_OTHER): Payer: PPO | Admitting: Orthopaedic Surgery

## 2016-02-07 DIAGNOSIS — M25551 Pain in right hip: Secondary | ICD-10-CM | POA: Diagnosis not present

## 2016-02-07 DIAGNOSIS — M25562 Pain in left knee: Secondary | ICD-10-CM

## 2016-02-07 NOTE — Progress Notes (Signed)
The patient comes in today saying that all his pain has resolved completely after having a steroid taper followed by meloxicam. He is pain-free with his hip, his elbow, or his knee. He has no issues at all today he states.  On exam all of his joints including his elbows hips and knees are normal without effusions, are pain-free with mobility, and abnormal motion.  At this point I'll follow up as needed since he is doing so well. He can always come back to see [place] at any time if there are any issues arise.

## 2016-02-28 DIAGNOSIS — H26491 Other secondary cataract, right eye: Secondary | ICD-10-CM | POA: Diagnosis not present

## 2016-03-09 DIAGNOSIS — Z961 Presence of intraocular lens: Secondary | ICD-10-CM | POA: Diagnosis not present

## 2016-03-09 DIAGNOSIS — H26491 Other secondary cataract, right eye: Secondary | ICD-10-CM | POA: Diagnosis not present

## 2016-03-09 DIAGNOSIS — H18413 Arcus senilis, bilateral: Secondary | ICD-10-CM | POA: Diagnosis not present

## 2016-03-09 DIAGNOSIS — H02839 Dermatochalasis of unspecified eye, unspecified eyelid: Secondary | ICD-10-CM | POA: Diagnosis not present

## 2016-03-19 DIAGNOSIS — H26491 Other secondary cataract, right eye: Secondary | ICD-10-CM | POA: Diagnosis not present

## 2016-06-27 DIAGNOSIS — L570 Actinic keratosis: Secondary | ICD-10-CM | POA: Diagnosis not present

## 2016-06-27 DIAGNOSIS — L821 Other seborrheic keratosis: Secondary | ICD-10-CM | POA: Diagnosis not present

## 2016-06-27 DIAGNOSIS — D1801 Hemangioma of skin and subcutaneous tissue: Secondary | ICD-10-CM | POA: Diagnosis not present

## 2016-06-27 DIAGNOSIS — Z85828 Personal history of other malignant neoplasm of skin: Secondary | ICD-10-CM | POA: Diagnosis not present

## 2016-07-17 DIAGNOSIS — H35033 Hypertensive retinopathy, bilateral: Secondary | ICD-10-CM | POA: Diagnosis not present

## 2016-07-17 DIAGNOSIS — H524 Presbyopia: Secondary | ICD-10-CM | POA: Diagnosis not present

## 2016-08-03 DIAGNOSIS — Z961 Presence of intraocular lens: Secondary | ICD-10-CM | POA: Diagnosis not present

## 2016-08-03 DIAGNOSIS — H02839 Dermatochalasis of unspecified eye, unspecified eyelid: Secondary | ICD-10-CM | POA: Diagnosis not present

## 2016-08-03 DIAGNOSIS — H18413 Arcus senilis, bilateral: Secondary | ICD-10-CM | POA: Diagnosis not present

## 2016-08-03 DIAGNOSIS — H26492 Other secondary cataract, left eye: Secondary | ICD-10-CM | POA: Diagnosis not present

## 2016-08-08 DIAGNOSIS — H26492 Other secondary cataract, left eye: Secondary | ICD-10-CM | POA: Diagnosis not present

## 2016-12-18 DIAGNOSIS — R251 Tremor, unspecified: Secondary | ICD-10-CM | POA: Diagnosis not present

## 2016-12-18 DIAGNOSIS — E7800 Pure hypercholesterolemia, unspecified: Secondary | ICD-10-CM | POA: Diagnosis not present

## 2016-12-18 DIAGNOSIS — G479 Sleep disorder, unspecified: Secondary | ICD-10-CM | POA: Diagnosis not present

## 2016-12-18 DIAGNOSIS — Z8042 Family history of malignant neoplasm of prostate: Secondary | ICD-10-CM | POA: Diagnosis not present

## 2016-12-18 DIAGNOSIS — Z1159 Encounter for screening for other viral diseases: Secondary | ICD-10-CM | POA: Diagnosis not present

## 2016-12-18 DIAGNOSIS — Z1389 Encounter for screening for other disorder: Secondary | ICD-10-CM | POA: Diagnosis not present

## 2016-12-18 DIAGNOSIS — N529 Male erectile dysfunction, unspecified: Secondary | ICD-10-CM | POA: Diagnosis not present

## 2016-12-18 DIAGNOSIS — Z0000 Encounter for general adult medical examination without abnormal findings: Secondary | ICD-10-CM | POA: Diagnosis not present

## 2016-12-18 DIAGNOSIS — M1990 Unspecified osteoarthritis, unspecified site: Secondary | ICD-10-CM | POA: Diagnosis not present

## 2017-01-02 DIAGNOSIS — L821 Other seborrheic keratosis: Secondary | ICD-10-CM | POA: Diagnosis not present

## 2017-01-02 DIAGNOSIS — Z85820 Personal history of malignant melanoma of skin: Secondary | ICD-10-CM | POA: Diagnosis not present

## 2017-01-02 DIAGNOSIS — L570 Actinic keratosis: Secondary | ICD-10-CM | POA: Diagnosis not present

## 2017-01-02 DIAGNOSIS — Z85828 Personal history of other malignant neoplasm of skin: Secondary | ICD-10-CM | POA: Diagnosis not present

## 2017-01-30 DIAGNOSIS — H00025 Hordeolum internum left lower eyelid: Secondary | ICD-10-CM | POA: Diagnosis not present

## 2017-03-12 DIAGNOSIS — H903 Sensorineural hearing loss, bilateral: Secondary | ICD-10-CM | POA: Diagnosis not present

## 2017-04-10 DIAGNOSIS — Z86010 Personal history of colonic polyps: Secondary | ICD-10-CM | POA: Diagnosis not present

## 2017-04-10 DIAGNOSIS — D126 Benign neoplasm of colon, unspecified: Secondary | ICD-10-CM | POA: Diagnosis not present

## 2017-04-12 DIAGNOSIS — D126 Benign neoplasm of colon, unspecified: Secondary | ICD-10-CM | POA: Diagnosis not present

## 2017-07-10 DIAGNOSIS — L821 Other seborrheic keratosis: Secondary | ICD-10-CM | POA: Diagnosis not present

## 2017-07-10 DIAGNOSIS — Z85820 Personal history of malignant melanoma of skin: Secondary | ICD-10-CM | POA: Diagnosis not present

## 2017-07-10 DIAGNOSIS — D485 Neoplasm of uncertain behavior of skin: Secondary | ICD-10-CM | POA: Diagnosis not present

## 2017-07-10 DIAGNOSIS — L570 Actinic keratosis: Secondary | ICD-10-CM | POA: Diagnosis not present

## 2017-07-10 DIAGNOSIS — L814 Other melanin hyperpigmentation: Secondary | ICD-10-CM | POA: Diagnosis not present

## 2017-07-10 DIAGNOSIS — Z85828 Personal history of other malignant neoplasm of skin: Secondary | ICD-10-CM | POA: Diagnosis not present

## 2017-07-15 DIAGNOSIS — H35033 Hypertensive retinopathy, bilateral: Secondary | ICD-10-CM | POA: Diagnosis not present

## 2017-07-15 DIAGNOSIS — H5203 Hypermetropia, bilateral: Secondary | ICD-10-CM | POA: Diagnosis not present

## 2018-01-02 DIAGNOSIS — N529 Male erectile dysfunction, unspecified: Secondary | ICD-10-CM | POA: Diagnosis not present

## 2018-01-02 DIAGNOSIS — Z86010 Personal history of colonic polyps: Secondary | ICD-10-CM | POA: Diagnosis not present

## 2018-01-02 DIAGNOSIS — Z0000 Encounter for general adult medical examination without abnormal findings: Secondary | ICD-10-CM | POA: Diagnosis not present

## 2018-01-02 DIAGNOSIS — R7303 Prediabetes: Secondary | ICD-10-CM | POA: Diagnosis not present

## 2018-01-02 DIAGNOSIS — Z125 Encounter for screening for malignant neoplasm of prostate: Secondary | ICD-10-CM | POA: Diagnosis not present

## 2018-01-02 DIAGNOSIS — M1990 Unspecified osteoarthritis, unspecified site: Secondary | ICD-10-CM | POA: Diagnosis not present

## 2018-01-02 DIAGNOSIS — G479 Sleep disorder, unspecified: Secondary | ICD-10-CM | POA: Diagnosis not present

## 2018-01-02 DIAGNOSIS — Z1389 Encounter for screening for other disorder: Secondary | ICD-10-CM | POA: Diagnosis not present

## 2018-01-02 DIAGNOSIS — E7800 Pure hypercholesterolemia, unspecified: Secondary | ICD-10-CM | POA: Diagnosis not present

## 2018-01-13 DIAGNOSIS — Z85820 Personal history of malignant melanoma of skin: Secondary | ICD-10-CM | POA: Diagnosis not present

## 2018-01-13 DIAGNOSIS — D044 Carcinoma in situ of skin of scalp and neck: Secondary | ICD-10-CM | POA: Diagnosis not present

## 2018-01-13 DIAGNOSIS — Z85828 Personal history of other malignant neoplasm of skin: Secondary | ICD-10-CM | POA: Diagnosis not present

## 2018-01-13 DIAGNOSIS — L905 Scar conditions and fibrosis of skin: Secondary | ICD-10-CM | POA: Diagnosis not present

## 2018-01-13 DIAGNOSIS — D485 Neoplasm of uncertain behavior of skin: Secondary | ICD-10-CM | POA: Diagnosis not present

## 2018-01-13 DIAGNOSIS — L821 Other seborrheic keratosis: Secondary | ICD-10-CM | POA: Diagnosis not present

## 2018-01-13 DIAGNOSIS — L570 Actinic keratosis: Secondary | ICD-10-CM | POA: Diagnosis not present

## 2018-07-17 DIAGNOSIS — L821 Other seborrheic keratosis: Secondary | ICD-10-CM | POA: Diagnosis not present

## 2018-07-17 DIAGNOSIS — C44629 Squamous cell carcinoma of skin of left upper limb, including shoulder: Secondary | ICD-10-CM | POA: Diagnosis not present

## 2018-07-17 DIAGNOSIS — C44319 Basal cell carcinoma of skin of other parts of face: Secondary | ICD-10-CM | POA: Diagnosis not present

## 2018-07-17 DIAGNOSIS — Z85828 Personal history of other malignant neoplasm of skin: Secondary | ICD-10-CM | POA: Diagnosis not present

## 2018-07-17 DIAGNOSIS — D485 Neoplasm of uncertain behavior of skin: Secondary | ICD-10-CM | POA: Diagnosis not present

## 2018-07-17 DIAGNOSIS — Z85820 Personal history of malignant melanoma of skin: Secondary | ICD-10-CM | POA: Diagnosis not present

## 2018-07-17 DIAGNOSIS — L570 Actinic keratosis: Secondary | ICD-10-CM | POA: Diagnosis not present

## 2018-08-04 DIAGNOSIS — R030 Elevated blood-pressure reading, without diagnosis of hypertension: Secondary | ICD-10-CM | POA: Diagnosis not present

## 2018-08-04 DIAGNOSIS — T162XXA Foreign body in left ear, initial encounter: Secondary | ICD-10-CM | POA: Diagnosis not present

## 2018-08-04 DIAGNOSIS — Z974 Presence of external hearing-aid: Secondary | ICD-10-CM | POA: Diagnosis not present

## 2018-08-04 DIAGNOSIS — H903 Sensorineural hearing loss, bilateral: Secondary | ICD-10-CM | POA: Diagnosis not present

## 2018-10-14 DIAGNOSIS — M25569 Pain in unspecified knee: Secondary | ICD-10-CM | POA: Diagnosis not present

## 2018-11-24 ENCOUNTER — Ambulatory Visit: Payer: Self-pay

## 2018-11-24 ENCOUNTER — Ambulatory Visit (INDEPENDENT_AMBULATORY_CARE_PROVIDER_SITE_OTHER): Payer: PPO | Admitting: Orthopaedic Surgery

## 2018-11-24 ENCOUNTER — Encounter: Payer: Self-pay | Admitting: Orthopaedic Surgery

## 2018-11-24 DIAGNOSIS — G8929 Other chronic pain: Secondary | ICD-10-CM | POA: Diagnosis not present

## 2018-11-24 DIAGNOSIS — M25562 Pain in left knee: Secondary | ICD-10-CM | POA: Diagnosis not present

## 2018-11-24 MED ORDER — METHYLPREDNISOLONE ACETATE 40 MG/ML IJ SUSP
40.0000 mg | INTRAMUSCULAR | Status: AC | PRN
  Administered 2018-11-24: 40 mg via INTRA_ARTICULAR

## 2018-11-24 MED ORDER — LIDOCAINE HCL 1 % IJ SOLN
3.0000 mL | INTRAMUSCULAR | Status: AC | PRN
  Administered 2018-11-24: 3 mL

## 2018-11-24 NOTE — Progress Notes (Signed)
Office Visit Note   Patient: Sean Norris           Date of Birth: 1946-01-12           MRN: [id number] Visit Date: 11/24/2018              Requested by: Kelton Pillar, MD [address] [place],  [place] [address] PCP: Kelton Pillar, MD   Assessment & Plan: Visit Diagnoses:  1. Chronic pain of left knee     Plan: At this point an MRI is warranted of his left knee given the fullness in the popliteal region and his plain films showing no acute findings.  I did provide a second steroid injection in his knee to see if this will help alleviate some of his symptoms while we waited MRI.  I do feel it is medically necessary at this point given failure conservative treatment.  All questions were answered addressed we will see him back in 4 weeks and hopefully will have an MRI of his left knee by then.  Follow-Up Instructions: Return in about 4 weeks (around 12/22/2018).   Orders:  Orders Placed This Encounter  Procedures  . Large Joint Inj  . XR KNEE 3 VIEW LEFT   No orders of the defined types were placed in this encounter.     Procedures: Large Joint Inj: L knee on 11/24/2018 2:37 PM Indications: diagnostic evaluation and pain Details: 22 G 1.5 in needle, superolateral approach  Arthrogram: No  Medications: 3 mL lidocaine 1 %; 40 mg methylPREDNISolone acetate 40 MG/ML Outcome: tolerated well, no immediate complications Procedure, treatment alternatives, risks and benefits explained, specific risks discussed. Consent was given by the patient. Immediately prior to procedure a time out was called to verify the correct patient, procedure, equipment, support staff and site/side marked as required. Patient was prepped and draped in the usual sterile fashion.       Clinical Data: No additional findings.   Subjective: Chief Complaint  Patient presents with  . Left Knee - Pain  Patient is a very active 73 year old gentleman that I am seeing for his left knee.   Actually saw him in 2017 for his left knee and a steroid injection helped greatly.  He is gotten to where he is having fullness in the back of his knee again and he hurts with weightbearing activities and pivoting activities.  It is worse at the end of the day than it is at the beginning of the day.  He had a recent oral steroid which should help alleviate some of the pain but it then returned.  He points to the popliteal area as a source of his pain in the back of his knee.  He denies any acute injuries.  He is never had surgery on that knee.  HPI  Review of Systems He currently denies any headache, chest pain, shortness of breath, fever, chills, nausea, vomiting  Objective: Vital Signs: There were no vitals taken for this visit.  Physical Exam He is alert and orient x3 and in no acute distress Ortho Exam Examination of his left knee shows some pain in the popliteal area of the knee with good range of motion but when she is flexing past 90 degrees he has a lot of pain in the back of the knee.  Is otherwise ligamentously stable with no effusion today. Specialty Comments:  No specialty comments available.  Imaging: Xr Knee 3 View Left  Result Date: 11/24/2018 3 views  of the left knee show well-maintained joint space with no acute findings.    PMFS History: Patient Active Problem List   Diagnosis Date Noted  . NEPHROLITHIASIS, HX OF 11/28/2006   History reviewed. No pertinent past medical history.  History reviewed. No pertinent family history.  History reviewed. No pertinent surgical history. Social History   Occupational History  . Not on file  Tobacco Use  . Smoking status: Not on file  Substance and Sexual Activity  . Alcohol use: Not on file  . Drug use: Not on file  . Sexual activity: Not on file

## 2018-11-25 ENCOUNTER — Other Ambulatory Visit: Payer: Self-pay

## 2018-11-25 DIAGNOSIS — M25562 Pain in left knee: Secondary | ICD-10-CM

## 2018-11-25 DIAGNOSIS — G8929 Other chronic pain: Secondary | ICD-10-CM

## 2018-12-20 ENCOUNTER — Other Ambulatory Visit: Payer: Self-pay

## 2018-12-20 ENCOUNTER — Ambulatory Visit
Admission: RE | Admit: 2018-12-20 | Discharge: 2018-12-20 | Disposition: A | Discharge status: Home / Self Care | Payer: PPO | Source: Ambulatory Visit | Attending: Orthopaedic Surgery | Admitting: Orthopaedic Surgery

## 2018-12-20 ENCOUNTER — Other Ambulatory Visit: Payer: PPO

## 2018-12-20 DIAGNOSIS — M25562 Pain in left knee: Secondary | ICD-10-CM

## 2018-12-20 DIAGNOSIS — G8929 Other chronic pain: Secondary | ICD-10-CM

## 2018-12-22 ENCOUNTER — Ambulatory Visit: Payer: PPO | Admitting: Orthopaedic Surgery

## 2018-12-24 ENCOUNTER — Ambulatory Visit: Payer: PPO | Admitting: Orthopaedic Surgery

## 2018-12-30 ENCOUNTER — Other Ambulatory Visit: Payer: Self-pay

## 2018-12-30 ENCOUNTER — Encounter: Payer: Self-pay | Admitting: Orthopaedic Surgery

## 2018-12-30 ENCOUNTER — Ambulatory Visit: Payer: PPO | Admitting: Orthopaedic Surgery

## 2018-12-30 DIAGNOSIS — S83242D Other tear of medial meniscus, current injury, left knee, subsequent encounter: Secondary | ICD-10-CM | POA: Diagnosis not present

## 2018-12-30 DIAGNOSIS — G8929 Other chronic pain: Secondary | ICD-10-CM

## 2018-12-30 DIAGNOSIS — M25562 Pain in left knee: Secondary | ICD-10-CM

## 2018-12-30 NOTE — Progress Notes (Signed)
The patient comes in today to go over an MRI of his left knee.  He is a very active and thin 73 year old gentleman who is been dealing with left knee pain and locking catching for some time.  It does not start up type of pain like osteoarthritis but is more mechanical in nature.  He has had at least 2 steroid injections in that left knee to temporize his symptoms only for short amount of time.  His x-rays still showed open joint space would recommend an MRI to assess for any other pathology may be causing his mechanical symptoms and pain.  On examination of his left knee his main pain is along the medial joint line and posterior medial.  His knee feels unstable with good range of motion.  There is only a slight effusion.  MRI does show left knee synovitis.  There is an oblique tear of the medial meniscus but only some thinning of the articular cartilage which is moderate but there is no full-thickness cartilage loss at all.  There is slight tearing of the lateral meniscus.  There is mucoid degeneration of the ACL.  He does have a medial plica as well and he is symptomatic over this medial plica that is seen on the MRI.  At this point we are recommending a surgical intervention of this left knee which would be an arthroscopic intervention given the meniscal tear and the symptomatic plica.  I do feel these are mechanical symptoms as does he.  He does have some arthritic changes in his knee but this is only mild to moderate not severe.  I explained the risk and benefits of knee arthroscopic surgery.  We talked about his interoperative and postoperative course.  I showed him a knee model explained what we would do as well.  All question concerns were answered and addressed.  We will set this up for surgery in the near future.  He is an otherwise healthy individual and not on any blood thinning medications and is not a diabetic, not a smoker, and denies sleep apnea.

## 2019-01-02 ENCOUNTER — Other Ambulatory Visit: Payer: Self-pay

## 2019-01-06 ENCOUNTER — Telehealth: Payer: Self-pay | Admitting: Orthopaedic Surgery

## 2019-01-06 NOTE — Telephone Encounter (Signed)
Patient's wife called wanting to let someone know that the patient has a hard time waking up after surgery and will lay there an shake.  Patient stated that she will be in the parking lot and if someone calls her that she can get him to wake up.  CB#[phone].  Thank you.

## 2019-01-06 NOTE — Telephone Encounter (Signed)
Just FYI.

## 2019-01-08 ENCOUNTER — Other Ambulatory Visit: Payer: Self-pay | Admitting: Orthopaedic Surgery

## 2019-01-08 DIAGNOSIS — X58XXXA Exposure to other specified factors, initial encounter: Secondary | ICD-10-CM | POA: Diagnosis not present

## 2019-01-08 DIAGNOSIS — S83282A Other tear of lateral meniscus, current injury, left knee, initial encounter: Secondary | ICD-10-CM | POA: Diagnosis not present

## 2019-01-08 DIAGNOSIS — M94262 Chondromalacia, left knee: Secondary | ICD-10-CM | POA: Diagnosis not present

## 2019-01-08 DIAGNOSIS — S83242A Other tear of medial meniscus, current injury, left knee, initial encounter: Secondary | ICD-10-CM | POA: Diagnosis not present

## 2019-01-08 DIAGNOSIS — S83232A Complex tear of medial meniscus, current injury, left knee, initial encounter: Secondary | ICD-10-CM | POA: Diagnosis not present

## 2019-01-08 DIAGNOSIS — Y999 Unspecified external cause status: Secondary | ICD-10-CM | POA: Diagnosis not present

## 2019-01-08 MED ORDER — TRAMADOL HCL 50 MG PO TABS: 50.0000 mg | ORAL_TABLET | Freq: Four times a day (QID) | ORAL | 0 refills | Status: AC | PRN

## 2019-01-08 MED ORDER — METHOCARBAMOL 500 MG PO TABS: 500.0000 mg | ORAL_TABLET | Freq: Four times a day (QID) | ORAL | 1 refills | Status: AC | PRN

## 2019-01-15 ENCOUNTER — Ambulatory Visit (INDEPENDENT_AMBULATORY_CARE_PROVIDER_SITE_OTHER): Payer: PPO | Admitting: Orthopaedic Surgery

## 2019-01-15 ENCOUNTER — Encounter: Payer: Self-pay | Admitting: Orthopaedic Surgery

## 2019-01-15 ENCOUNTER — Other Ambulatory Visit: Payer: Self-pay

## 2019-01-15 DIAGNOSIS — Z9889 Other specified postprocedural states: Secondary | ICD-10-CM

## 2019-01-15 NOTE — Progress Notes (Signed)
The patient is 1 month out from a left knee arthroscopy with a partial medial meniscectomy.  He is a very active 73 year old gentleman.  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  There was some fraying of the lateral meniscus but her main finding was a posterior horn mid body medial meniscal tear.  We were able to perform a partial medial meniscectomy.  He reports he is done well since surgery.  There is some aching pains and swelling in his knee but he said is much less swollen than it was a few days ago.  He is a very active individual and does not walk with assistive device.  On exam we did remove the sutures.  He has a moderate knee effusion but is not painful to him at all.  He can extend his knee easily and flex it easily.  We did go over the arthroscopy pictures with him.  I will have him work on quad strengthening exercises.  All question concerns were answered and addressed.  I will see him back in 4 weeks just to see how is doing from a mobility standpoint.  If he still has any significant effusion we can always aspirate his knee.  If he is having any residual knee pain we can always place a steroid injection then to if needed.

## 2019-01-20 DIAGNOSIS — Z85820 Personal history of malignant melanoma of skin: Secondary | ICD-10-CM | POA: Diagnosis not present

## 2019-01-20 DIAGNOSIS — D1801 Hemangioma of skin and subcutaneous tissue: Secondary | ICD-10-CM | POA: Diagnosis not present

## 2019-01-20 DIAGNOSIS — L820 Inflamed seborrheic keratosis: Secondary | ICD-10-CM | POA: Diagnosis not present

## 2019-01-20 DIAGNOSIS — D044 Carcinoma in situ of skin of scalp and neck: Secondary | ICD-10-CM | POA: Diagnosis not present

## 2019-01-20 DIAGNOSIS — Z85828 Personal history of other malignant neoplasm of skin: Secondary | ICD-10-CM | POA: Diagnosis not present

## 2019-01-20 DIAGNOSIS — L821 Other seborrheic keratosis: Secondary | ICD-10-CM | POA: Diagnosis not present

## 2019-01-20 DIAGNOSIS — L570 Actinic keratosis: Secondary | ICD-10-CM | POA: Diagnosis not present

## 2019-01-20 DIAGNOSIS — D485 Neoplasm of uncertain behavior of skin: Secondary | ICD-10-CM | POA: Diagnosis not present

## 2019-02-05 DIAGNOSIS — Z1389 Encounter for screening for other disorder: Secondary | ICD-10-CM | POA: Diagnosis not present

## 2019-02-05 DIAGNOSIS — C4490 Unspecified malignant neoplasm of skin, unspecified: Secondary | ICD-10-CM | POA: Diagnosis not present

## 2019-02-05 DIAGNOSIS — Z125 Encounter for screening for malignant neoplasm of prostate: Secondary | ICD-10-CM | POA: Diagnosis not present

## 2019-02-05 DIAGNOSIS — M1990 Unspecified osteoarthritis, unspecified site: Secondary | ICD-10-CM | POA: Diagnosis not present

## 2019-02-05 DIAGNOSIS — R251 Tremor, unspecified: Secondary | ICD-10-CM | POA: Diagnosis not present

## 2019-02-05 DIAGNOSIS — G479 Sleep disorder, unspecified: Secondary | ICD-10-CM | POA: Diagnosis not present

## 2019-02-05 DIAGNOSIS — R030 Elevated blood-pressure reading, without diagnosis of hypertension: Secondary | ICD-10-CM | POA: Diagnosis not present

## 2019-02-05 DIAGNOSIS — E7800 Pure hypercholesterolemia, unspecified: Secondary | ICD-10-CM | POA: Diagnosis not present

## 2019-02-05 DIAGNOSIS — N529 Male erectile dysfunction, unspecified: Secondary | ICD-10-CM | POA: Diagnosis not present

## 2019-02-05 DIAGNOSIS — R7303 Prediabetes: Secondary | ICD-10-CM | POA: Diagnosis not present

## 2019-02-05 DIAGNOSIS — Z0000 Encounter for general adult medical examination without abnormal findings: Secondary | ICD-10-CM | POA: Diagnosis not present

## 2019-02-05 DIAGNOSIS — Z86010 Personal history of colonic polyps: Secondary | ICD-10-CM | POA: Diagnosis not present

## 2019-02-12 ENCOUNTER — Encounter: Payer: Self-pay | Admitting: Orthopaedic Surgery

## 2019-02-12 ENCOUNTER — Other Ambulatory Visit: Payer: Self-pay

## 2019-02-12 ENCOUNTER — Ambulatory Visit (INDEPENDENT_AMBULATORY_CARE_PROVIDER_SITE_OTHER): Payer: PPO | Admitting: Orthopaedic Surgery

## 2019-02-12 DIAGNOSIS — Z9889 Other specified postprocedural states: Secondary | ICD-10-CM | POA: Diagnosis not present

## 2019-02-12 DIAGNOSIS — M7052 Other bursitis of knee, left knee: Secondary | ICD-10-CM | POA: Diagnosis not present

## 2019-02-12 MED ORDER — LIDOCAINE HCL 1 % IJ SOLN
3.0000 mL | INTRAMUSCULAR | Status: AC | PRN
  Administered 2019-02-12: 3 mL

## 2019-02-12 MED ORDER — METHYLPREDNISOLONE ACETATE 40 MG/ML IJ SUSP
40.0000 mg | INTRAMUSCULAR | Status: AC | PRN
  Administered 2019-02-12: 16:00:00 40 mg via INTRA_ARTICULAR

## 2019-02-12 NOTE — Progress Notes (Signed)
   Procedure Note  Patient: Sean Norris             Date of Birth: 02-01-1946           MRN: [id number]             Visit Date: 02/12/2019  Procedures: Visit Diagnoses:  1. Status post arthroscopy of left knee   2. Patellar bursitis of left knee     Large Joint Inj: L knee on 02/12/2019 4:18 PM Indications: diagnostic evaluation and pain Details: 22 G 1.5 in needle, superolateral approach  Arthrogram: No  Medications: 3 mL lidocaine 1 %; 40 mg methylPREDNISolone acetate 40 MG/ML Outcome: tolerated well, no immediate complications Procedure, treatment alternatives, risks and benefits explained, specific risks discussed. Consent was given by the patient. Immediately prior to procedure a time out was called to verify the correct patient, procedure, equipment, support staff and site/side marked as required. Patient was prepped and draped in the usual sterile fashion.    Patient is a very pleasant 72 year old gentleman who is about 6 weeks out from a knee arthroscopy of his left knee.  He had an acute medial meniscal tear.  He only had some cartilage thinning but not severe.  There was some slight area of full-thickness cartilage loss but his knee is more symptomatic from the meniscal tear.  He does report some posterior medial knee pain but says he is doing well overall.  On exam though he does have a prepatellar bursal fluid collection.  He then let me know that he had tripped recently falling directly on his patella.  His extensor mechanism is intact but he does have prepatellar bursitis.  There is no redness.  I was able to aspirate about 15 cc of fluid from this and decompress it completely.  I then decided to go ahead and place a steroid injection in the knee joint itself which he tolerated well.  He says his knee felt better already.  I would like to see him back for potentially a final visit just 4 weeks.  I did let him know if the prepatellar bursitis reaccumulate and he would like to  have that drained sooner he will give [place] a call to follow-up sooner.  If not again we will see him back in 4 weeks.

## 2019-03-09 DIAGNOSIS — Z125 Encounter for screening for malignant neoplasm of prostate: Secondary | ICD-10-CM | POA: Diagnosis not present

## 2019-03-09 DIAGNOSIS — Z8042 Family history of malignant neoplasm of prostate: Secondary | ICD-10-CM | POA: Diagnosis not present

## 2019-03-09 DIAGNOSIS — E7800 Pure hypercholesterolemia, unspecified: Secondary | ICD-10-CM | POA: Diagnosis not present

## 2019-03-09 DIAGNOSIS — R7303 Prediabetes: Secondary | ICD-10-CM | POA: Diagnosis not present

## 2019-03-11 ENCOUNTER — Other Ambulatory Visit: Payer: Self-pay

## 2019-03-11 ENCOUNTER — Encounter: Payer: Self-pay | Admitting: Physician Assistant

## 2019-03-11 ENCOUNTER — Ambulatory Visit (INDEPENDENT_AMBULATORY_CARE_PROVIDER_SITE_OTHER): Payer: PPO | Admitting: Physician Assistant

## 2019-03-11 DIAGNOSIS — M7052 Other bursitis of knee, left knee: Secondary | ICD-10-CM

## 2019-03-11 NOTE — Progress Notes (Signed)
Sean Norris is a 74 year old male well-known to our department service comes in today due to left knee patellar bursitis.  He states that the left knee intra-articular injection given to him on 02/12/2019 helped.  However since he was last seen he has had increased swelling over the anterior aspect of the left knee.  No fevers chills.  States the swelling was worse of this weekend but this dissipated some.  He has had no new injury to the knee.  Physical exam: Left knee prepatellar bursitis no significant erythema or abnormal warmth.  Good range of motion of the knee.  Impression left knee prepatellar bursitis  Plan left knee was prepped with Betadine and from a lateral approach prepatellar region was anesthetized with lidocaine 2 cc and then a total of 7 cc of serous sanguinous fluid was aspirated patient tolerated well.  Ace bandage was applied.  He will follow-up with [place] in just 2 weeks to see what type of response he had to this treatment.  We will leave the Ace bandage on until tomorrow afternoon and then remove it.  [place] were encouraged and answered

## 2019-03-12 ENCOUNTER — Ambulatory Visit: Payer: PPO | Admitting: Orthopaedic Surgery

## 2019-03-25 ENCOUNTER — Ambulatory Visit: Payer: PPO | Admitting: Physician Assistant

## 2019-03-30 ENCOUNTER — Encounter: Payer: Self-pay | Admitting: Orthopaedic Surgery

## 2019-03-30 ENCOUNTER — Ambulatory Visit (INDEPENDENT_AMBULATORY_CARE_PROVIDER_SITE_OTHER): Payer: PPO | Admitting: Orthopaedic Surgery

## 2019-03-30 ENCOUNTER — Other Ambulatory Visit: Payer: Self-pay

## 2019-03-30 DIAGNOSIS — G8929 Other chronic pain: Secondary | ICD-10-CM

## 2019-03-30 DIAGNOSIS — Z9889 Other specified postprocedural states: Secondary | ICD-10-CM

## 2019-03-30 DIAGNOSIS — M7052 Other bursitis of knee, left knee: Secondary | ICD-10-CM

## 2019-03-30 DIAGNOSIS — M25562 Pain in left knee: Secondary | ICD-10-CM

## 2019-03-30 NOTE — Progress Notes (Signed)
The patient is a very active 74 year old well-known to [place].  He has a history of a left knee arthroscopy with a partial medial meniscectomy.  He did not have severe arthritic changes in that knee.  Since then he has developed a prepatellar bursitis.  We did drain fluid from his knee about 2 weeks ago.  We are seeing him back today in follow-up.  He has had some reaccumulation of fluid over the anterior aspect of his knee at the prepatellar bursa but not as bad as it was before.  He still has most of his pain being posterior medially.  On exam he does have a reaccumulation of fluid over the prepatellar area of the left knee.  Posterior medially I can feel a fluid collection.  I did aspirate fluid from both these areas after prepping it with Betadine and alcohol.  This is both the prepatellar area and the posterior medial area of the knee.  This gave him immediate relief.  We will see him back in about 4 weeks to see if this is continuing to improve.  Obviously before then if he needs to be seen you let us know.  At that visit we will still consider repeat aspirations and steroid injections if needed.  All question concerns were answered and addressed.

## 2019-04-18 ENCOUNTER — Ambulatory Visit: Payer: PPO

## 2019-04-19 ENCOUNTER — Ambulatory Visit: Payer: PPO | Attending: Internal Medicine

## 2019-04-19 DIAGNOSIS — Z23 Encounter for immunization: Secondary | ICD-10-CM | POA: Insufficient documentation

## 2019-04-19 NOTE — Progress Notes (Signed)
   Covid-19 Vaccination Clinic  Name:  ROLLEY ROSARIO    MRN: [id number] DOB: [birth year]  04/19/2019  Mr. Beaner was observed post Covid-19 immunization for 15 minutes without incidence. He was provided with Vaccine Information Sheet and instruction to access the V-Safe system.   Mr. Fingerhut was instructed to call 911 with any severe reactions post vaccine: Marland Kitchen Difficulty breathing  . Swelling of your face and throat  . A fast heartbeat  . A bad rash all over your body  . Dizziness and weakness    Immunizations Administered    Name Date Dose VIS Date Route   Pfizer COVID-19 Vaccine 04/19/2019  2:29 PM 0.3 mL 02/06/2019 Intramuscular   Manufacturer: [place]   Lot: [id number]   [place]: [id number]

## 2019-04-23 ENCOUNTER — Ambulatory Visit: Payer: Self-pay | Admitting: Orthopaedic Surgery

## 2019-05-13 ENCOUNTER — Ambulatory Visit: Payer: PPO | Attending: Internal Medicine

## 2019-05-13 ENCOUNTER — Ambulatory Visit (INDEPENDENT_AMBULATORY_CARE_PROVIDER_SITE_OTHER): Payer: PPO | Admitting: Orthopaedic Surgery

## 2019-05-13 ENCOUNTER — Encounter: Payer: Self-pay | Admitting: Orthopaedic Surgery

## 2019-05-13 ENCOUNTER — Other Ambulatory Visit: Payer: Self-pay

## 2019-05-13 DIAGNOSIS — G8929 Other chronic pain: Secondary | ICD-10-CM

## 2019-05-13 DIAGNOSIS — M25562 Pain in left knee: Secondary | ICD-10-CM | POA: Diagnosis not present

## 2019-05-13 DIAGNOSIS — Z9889 Other specified postprocedural states: Secondary | ICD-10-CM | POA: Diagnosis not present

## 2019-05-13 DIAGNOSIS — Z23 Encounter for immunization: Secondary | ICD-10-CM

## 2019-05-13 NOTE — Progress Notes (Signed)
Mr. Fendt is now 4 months status post a left knee arthroscopy.  We found grade III chondromalacia of the medial compartment of his knee.  He had chronic tearing of the medial lateral meniscus.  His ACL was intact.  He is very active 74 years old.  He has had no knee effusion but does report start up pain with his knee and he says if he sitting for any short period time he does stand up it is very painful.  On exam there is no effusion of his left knee today.  His range of motion is full.  He is got good strength in his quads.  The knee feels ligamentously stable.  I did talk about his knee again in terms of the arthroscopy findings.  Certainly he is dealing with some arthritic changes in his knee.  Only thing I would recommend for now is quad strengthening exercises and time as well as trying an over-the-counter supplements such as glucosamine or even turmeric.  He agreed with this treatment plan.  We can see him back in 3 months to see how he is doing overall.  At that visit I would like a standing AP and lateral of his left knee.  All questions and concerns were answered and addressed.

## 2019-05-13 NOTE — Progress Notes (Signed)
   Covid-19 Vaccination Clinic  Name:  Sean Norris    MRN: [id number] DOB: 1945/05/13  05/13/2019  Mr. Ramp was observed post Covid-19 immunization for 15 minutes without incident. He was provided with Vaccine Information Sheet and instruction to access the V-Safe system.   [name] was instructed to call 911 with any severe reactions post vaccine: Marland Kitchen Difficulty breathing  . Swelling of face and throat  . A fast heartbeat  . A bad rash all over body  . Dizziness and weakness   Immunizations Administered    Name Date Dose VIS Date Route   Pfizer COVID-19 Vaccine 05/13/2019  9:55 AM 0.3 mL 02/06/2019 Intramuscular   Manufacturer: [place]   Lot: [id number]   [place]: [id number]

## 2019-07-21 DIAGNOSIS — C44622 Squamous cell carcinoma of skin of right upper limb, including shoulder: Secondary | ICD-10-CM | POA: Diagnosis not present

## 2019-07-21 DIAGNOSIS — L821 Other seborrheic keratosis: Secondary | ICD-10-CM | POA: Diagnosis not present

## 2019-07-21 DIAGNOSIS — Z85828 Personal history of other malignant neoplasm of skin: Secondary | ICD-10-CM | POA: Diagnosis not present

## 2019-07-21 DIAGNOSIS — L570 Actinic keratosis: Secondary | ICD-10-CM | POA: Diagnosis not present

## 2019-07-21 DIAGNOSIS — D485 Neoplasm of uncertain behavior of skin: Secondary | ICD-10-CM | POA: Diagnosis not present

## 2019-07-21 DIAGNOSIS — Z85820 Personal history of malignant melanoma of skin: Secondary | ICD-10-CM | POA: Diagnosis not present

## 2019-08-06 DIAGNOSIS — E7800 Pure hypercholesterolemia, unspecified: Secondary | ICD-10-CM | POA: Diagnosis not present

## 2019-08-06 DIAGNOSIS — R7303 Prediabetes: Secondary | ICD-10-CM | POA: Diagnosis not present

## 2019-08-06 DIAGNOSIS — K219 Gastro-esophageal reflux disease without esophagitis: Secondary | ICD-10-CM | POA: Diagnosis not present

## 2019-08-13 ENCOUNTER — Ambulatory Visit: Payer: PPO | Admitting: Orthopaedic Surgery

## 2019-09-24 ENCOUNTER — Ambulatory Visit: Payer: PPO | Admitting: Orthopaedic Surgery

## 2019-09-24 ENCOUNTER — Encounter: Payer: Self-pay | Admitting: Orthopaedic Surgery

## 2019-09-24 ENCOUNTER — Ambulatory Visit (INDEPENDENT_AMBULATORY_CARE_PROVIDER_SITE_OTHER): Payer: PPO

## 2019-09-24 DIAGNOSIS — Z9889 Other specified postprocedural states: Secondary | ICD-10-CM

## 2019-09-24 NOTE — Addendum Note (Signed)
Addended byLaurann Montana on: 09/24/2019 04:22 PM   Modules accepted: Orders

## 2019-09-24 NOTE — Progress Notes (Signed)
The patient is now 8 months status post a left knee arthroscopy where we had to debride medial and lateral meniscal tears.  He is very frustrated and the fact that he has not able to get going with that knee is much as he would like.  He says it just does not work white for him.  He is a very active 74 year old gentleman and we did go over his arthroscopy pictures again in the office today to show him the extent of his meniscal tears but he still had just some thinning of the cartilage in his knee with no significant cartilage loss at all.  The ACL is also intact.  On examination of his knee today there is some tenderness of the hamstring muscles and the gastrocs but there is no knee joint effusion his range of motion is excellent and the knee is ligamentously stable.  2 views of his left knee today show a normal-appearing knee in terms of no acute findings.  There is no effusion.  There is no malalignment and the joint spaces are well-maintained.  I would like him to try at least a short course of outpatient physical therapy with a therapist working on anything they can do to improve the proprioception of his left knee and the strengthening of that knee to help him hopefully feel better.  I have really nothing else that I can offer based on what we are seeing on his clinical exam and plain films as well as arthroscopy films.  I do since and agree with his frustration that he would like to be better as athletic as he has and is young appearing is his knee appears.  We will see him back in 6 weeks after course of therapy on the left knee.

## 2019-10-30 ENCOUNTER — Other Ambulatory Visit: Payer: Self-pay

## 2019-10-30 ENCOUNTER — Ambulatory Visit: Payer: PPO | Admitting: Rehabilitative and Restorative Service Providers"

## 2019-10-30 ENCOUNTER — Encounter: Payer: Self-pay | Admitting: Rehabilitative and Restorative Service Providers"

## 2019-10-30 DIAGNOSIS — M25562 Pain in left knee: Secondary | ICD-10-CM

## 2019-10-30 DIAGNOSIS — G8929 Other chronic pain: Secondary | ICD-10-CM

## 2019-10-30 DIAGNOSIS — R262 Difficulty in walking, not elsewhere classified: Secondary | ICD-10-CM

## 2019-10-30 DIAGNOSIS — M6281 Muscle weakness (generalized): Secondary | ICD-10-CM

## 2019-10-30 NOTE — Therapy (Signed)
Sean Norris, [place], [address] Phone: [phone]   Fax:  [phone]  Physical Therapy Evaluation  Patient Details  Name: Sean Norris MRN: [id number] Date of Birth: 1945-12-01 Referring Provider (PT): Dr. Ninfa Linden   Encounter Date: 10/30/2019   PT End of Session - 10/30/19 1307    Visit Number 1    Number of Visits 12    Date for PT Re-Evaluation 12/25/19    PT Start Time 1306    PT Stop Time 1340    PT Time Calculation (min) 34 min    Activity Tolerance Patient tolerated treatment well    Behavior During Therapy [facility] for tasks assessed/performed           History reviewed. No pertinent past medical history.  History reviewed. No pertinent surgical history.  There were no vitals filed for this visit.    Subjective Assessment - 10/30/19 1312    Subjective Pt. comes to clinic c complaints of Lt knee pain, s/p 9 months after arthroscopic surgery for debridement of medial and lateral meniscus.  Pt. stated continued pain complaints.    Limitations Walking;Standing    Diagnostic tests xray    Patient Stated Goals Reduce pain    Currently in Pain? Yes    Pain Score 4    severe pain at times   Pain Location Knee    Pain Orientation Left;Posterior    Pain Descriptors / Indicators Aching;Throbbing;Tightness    Pain Type Chronic pain    Pain Onset More than a month ago    Pain Frequency Constant    Aggravating Factors  sitting prolonged, walking, transfers after sitting    Pain Relieving Factors Changing positions at times.    Effect of Pain on Daily Activities Limited in standing/walking, prolonged sitting, recreational activity.              [facility] PT Assessment - 10/30/19 0001      Assessment   Medical Diagnosis Lt knee pain    Referring Provider (PT) Dr. Ninfa Linden    Onset Date/Surgical Date 02/27/19      Precautions   Precautions None      Restrictions   Weight Bearing Restrictions No       Balance Screen   Has the patient fallen in the past 6 months No    Is the patient reluctant to leave their home because of a fear of falling?  No      Home Ecologist residence    Home Layout Two level    Alternate Level Stairs-Number of Steps flight of stairs      Prior Function   Vocation Retired    Patent attorney, house/yard work, Oceanographer Status Within [employer] for tasks assessed      Functional Tests   Functional tests Sit to Stand;Single leg stance      Single Leg Stance   Comments Lt and Rt SLS < 3 seconds c assist to prevent LoB bilateral      Sit to Stand   Comments able to perform from 18 inch chair      ROM / Strength   AROM / PROM / Strength Strength;PROM;AROM      AROM   Overall AROM Comments WFL bilateral knee no complaints    AROM Assessment Site Knee    Right/Left Knee Left;Right      PROM   Overall PROM  Comments [facility]      Strength   Strength Assessment Site Hip;Ankle;Knee    Right/Left Hip Left;Right    Right Hip Flexion 5/5    Left Hip Flexion 5/5    Left Hip ABduction 5/5    Right/Left Knee Left;Right    Right Knee Flexion 5/5    Right Knee Extension 5/5    Left Knee Flexion 5/5    Left Knee Extension 4+/5    Right/Left Ankle Left;Right    Right Ankle Dorsiflexion 5/5    Left Ankle Dorsiflexion 5/5      Flexibility   Soft Tissue Assessment /Muscle Length yes    Hamstrings Rt passive SLR 90 deg, Lt 70 deg c tightness increased      Palpation   Palpation comment Mild increase in tightness distal hamstring medial/lateral on Lt to Rt      Special Tests   Other special tests (-) Slump, crossed slr, passive slr for radicular symptoms bilateral                      Objective measurements completed on examination: See above findings.       [place] Adult PT Treatment/Exercise - 10/30/19 0001      Exercises   Exercises Other Exercises    Other  Exercises  HEP instruction/performance consisting of sciatic nerve flossing 3 x 10, supine passive hamstring stretch c contralteral SLR, seated SLR 3 x 10, single leg stance 15 sec x 5 , gastroc stretch 30 sec x 5                  PT Education - 10/30/19 1307    Education Details HEP, POC    Person(s) Educated Patient    Methods Explanation;Demonstration;Handout;Verbal cues    Comprehension Returned demonstration;Verbalized understanding            PT Short Term Goals - 10/30/19 1316      PT SHORT TERM GOAL #1   Title Patient will demonstrate independent use of home exercise program to maintain progress from in clinic treatments.    Time 2    Period Weeks    Status New    Target Date 11/13/19             PT Long Term Goals - 10/30/19 1317      PT LONG TERM GOAL #1   Title Patient will demonstrate/report pain at worst less than or equal to 2/10 to facilitate minimal limitation in daily activity secondary to pain symptoms.    Time 8    Period Weeks    Status New    Target Date 12/25/19      PT LONG TERM GOAL #2   Title Patient will demonstrate independent use of home exercise program to facilitate ability to maintain/progress functional gains from skilled physical therapy services.    Time 8    Period Weeks    Status New    Target Date 12/25/19      PT LONG TERM GOAL #3   Title Pt. will demonstrate bilateral LE MMT 5/5 throughout to facilitate usual squat, transfers, stairs at PLOF.    Time 8    Period Weeks    Status New    Target Date 12/25/19      PT LONG TERM GOAL #4   Title Pt. will demonstrate bilateral SLS > 15 seconds to facilitate improved stability c uneven surface ambulation.    Time 8    Period Weeks  Status New    Target Date 12/25/19      PT LONG TERM GOAL #5   Title Pt. will demonstrate/report return to exercise routine at PLOF s limitation.    Time 8    Period Weeks    Status New    Target Date 12/25/19                   Plan - 10/30/19 1343    Clinical Impression Statement Patient is a 74 y.o. male who comes to clinic with complaints of Lt knee pain with strength and movement coordination deficits that impair their ability to perform usual daily and recreational functional activities without increase difficulty/symptoms at this time.  Patient to benefit from skilled PT services to address impairments and limitations to improve to previous level of function without restriction secondary to condition.    Personal Factors and Comorbidities Time since onset of injury/illness/exacerbation    Examination-Activity Limitations Sit;Squat;Stairs;Stand;Transfers;Locomotion Level    Examination-Participation Restrictions Community Activity;Other   exercise routine   Stability/Clinical Decision Making Stable/Uncomplicated    Clinical Decision Making Low    Rehab Potential Good    PT Frequency --   1-2x/week   PT Duration 8 weeks    PT Treatment/Interventions ADLs/Self Care Home Management;Cryotherapy;Electrical Stimulation;Iontophoresis 4mg /ml Dexamethasone;Therapeutic exercise;Moist Heat;Balance training;Therapeutic activities;Functional mobility training;Stair training;Gait training;Ultrasound;Neuromuscular re-education;Passive range of motion;Patient/family education;Dry needling;Spinal Manipulations;Joint Manipulations;Splinting;Vasopneumatic Device;Manual techniques    PT Next Visit Plan Review HEP, progress hamstring mobility, improved hip/knee strength/balance.    PT Home Exercise Plan [facility]    Consulted and Agree with Plan of Care Patient           Patient will benefit from skilled therapeutic intervention in order to improve the following deficits and impairments:  Decreased endurance, Decreased activity tolerance, Decreased strength, Pain, Difficulty walking, Decreased balance, Impaired perceived functional ability, Impaired flexibility  Visit Diagnosis: Chronic pain of left knee  Muscle  weakness (generalized)  Difficulty in walking, not elsewhere classified     Problem List Patient Active Problem List   Diagnosis Date Noted  . NEPHROLITHIASIS, HX OF 11/28/2006    Scot Jun, PT, DPT, OCS, ATC 10/30/19  1:45 PM    [facility] Physical Therapy [address] [place], [place], [address] Phone: [phone]   Fax:  [phone]  Name: Sean Norris MRN: [id number] Date of Birth: Dec 28, 1945

## 2019-10-30 NOTE — Patient Instructions (Signed)
Access Code: [facility] URL: [email or web address] Date: 10/30/2019 Prepared by: Scot Jun  Exercises Gastroc Stretch on Wall - 2 x daily - 7 x weekly - 5 reps - 1 sets - 30 hold Supine Hamstring Stretch with Strap - 2 x daily - 7 x weekly - 3 sets - 10 reps Seated Straight Leg Heel Taps - 2 x daily - 7 x weekly - 3 sets - 10 reps Seated Long Arc Quad - 2 x daily - 7 x weekly - 3 sets - 10 reps - 2 hold Single Leg Stance - 2 x daily - 7 x weekly - 1 sets - 5 reps - 15 hold

## 2019-11-05 ENCOUNTER — Ambulatory Visit: Payer: PPO | Admitting: Orthopaedic Surgery

## 2019-11-06 ENCOUNTER — Encounter: Payer: PPO | Admitting: Physical Therapy

## 2019-11-13 ENCOUNTER — Encounter: Payer: Self-pay | Admitting: Rehabilitative and Restorative Service Providers"

## 2019-11-13 ENCOUNTER — Other Ambulatory Visit: Payer: Self-pay

## 2019-11-13 ENCOUNTER — Ambulatory Visit: Payer: PPO | Admitting: Rehabilitative and Restorative Service Providers"

## 2019-11-13 DIAGNOSIS — G8929 Other chronic pain: Secondary | ICD-10-CM | POA: Diagnosis not present

## 2019-11-13 DIAGNOSIS — M6281 Muscle weakness (generalized): Secondary | ICD-10-CM | POA: Diagnosis not present

## 2019-11-13 DIAGNOSIS — M25562 Pain in left knee: Secondary | ICD-10-CM | POA: Diagnosis not present

## 2019-11-13 DIAGNOSIS — R262 Difficulty in walking, not elsewhere classified: Secondary | ICD-10-CM | POA: Diagnosis not present

## 2019-11-13 NOTE — Therapy (Addendum)
[facility] [address] [place], [place], [address] Phone: [phone]   Fax:  [phone]  Physical Therapy Treatment/Discharge  Patient Details  Name: Sean Norris MRN: [id number] Date of Birth: [birth year] Referring Provider (PT): Dr. Ninfa Linden   Encounter Date: 11/13/2019   PT End of Session - 11/13/19 1105    Visit Number 2    Number of Visits 12    Date for PT Re-Evaluation 12/25/19    PT Start Time 1101    PT Stop Time 1130    PT Time Calculation (min) 29 min    Activity Tolerance Patient tolerated treatment well    Behavior During Therapy [facility] for tasks assessed/performed           History reviewed. No pertinent past medical history.  History reviewed. No pertinent surgical history.  There were no vitals filed for this visit.   Subjective Assessment - 11/13/19 1105    Subjective Pt. indicated feeling maybe some progress since last visit.  Pt. stated doing HEP every morning and sometimes in evening.  Pt. stated no pain upon arrival.    Limitations Walking;Standing    Diagnostic tests xray    Patient Stated Goals Reduce pain    Currently in Pain? No/denies    Pain Score 0-No pain    Pain Onset More than a month ago                             [facility] Adult PT Treatment/Exercise - 11/13/19 0001      Exercises   Exercises Knee/Hip      Knee/Hip Exercises: Stretches   Gastroc Stretch 5 reps;30 seconds   incline board bilateral     Knee/Hip Exercises: Aerobic   Recumbent Bike Lvl 3 5 mins      Knee/Hip Exercises: Machines for Strengthening   Total Gym Leg Press SL 3 x 10 87 lbs, performed bilateral      Knee/Hip Exercises: Standing   Other Standing Knee Exercises hip abd into doorframe c SLS 5 sec x 10 bilateral      Knee/Hip Exercises: Supine   Bridges Both;3 sets;10 reps    Other Supine Knee/Hip Exercises passive hamstring stretch c contralateral SLR 3 x 10 bilateral                     PT Short Term Goals - 10/30/19 1316      PT SHORT TERM GOAL #1   Title Patient will demonstrate independent use of home exercise program to maintain progress from in clinic treatments.    Time 2    Period Weeks    Status New    Target Date 11/13/19             PT Long Term Goals - 10/30/19 1317      PT LONG TERM GOAL #1   Title Patient will demonstrate/report pain at worst less than or equal to 2/10 to facilitate minimal limitation in daily activity secondary to pain symptoms.    Time 8    Period Weeks    Status New    Target Date 12/25/19      PT LONG TERM GOAL #2   Title Patient will demonstrate independent use of home exercise program to facilitate ability to maintain/progress functional gains from skilled physical therapy services.    Time 8    Period Weeks    Status New    Target Date 12/25/19  PT LONG TERM GOAL #3   Title Pt. will demonstrate bilateral LE MMT 5/5 throughout to facilitate usual squat, transfers, stairs at PLOF.    Time 8    Period Weeks    Status New    Target Date 12/25/19      PT LONG TERM GOAL #4   Title Pt. will demonstrate bilateral SLS > 15 seconds to facilitate improved stability c uneven surface ambulation.    Time 8    Period Weeks    Status New    Target Date 12/25/19      PT LONG TERM GOAL #5   Title Pt. will demonstrate/report return to exercise routine at PLOF s limitation.    Time 8    Period Weeks    Status New    Target Date 12/25/19                 Plan - 11/13/19 1111    Clinical Impression Statement Positive progression reported and knowledge of HEP acceptable from first visit.  Pt. may continue to benefit from skilled PT services to improve strength and mobility related to LE at this time.    Personal Factors and Comorbidities Time since onset of injury/illness/exacerbation    Examination-Activity Limitations Sit;Squat;Stairs;Stand;Transfers;Locomotion Level     Examination-Participation Restrictions Community Activity;Other   exercise routine   Stability/Clinical Decision Making Stable/Uncomplicated    Rehab Potential Good    PT Frequency --   1-2x/week   PT Duration 8 weeks    PT Treatment/Interventions ADLs/Self Care Home Management;Cryotherapy;Electrical Stimulation;Iontophoresis 41m/ml Dexamethasone;Therapeutic exercise;Moist Heat;Balance training;Therapeutic activities;Functional mobility training;Stair training;Gait training;Ultrasound;Neuromuscular re-education;Passive range of motion;Patient/family education;Dry needling;Spinal Manipulations;Joint Manipulations;Splinting;Vasopneumatic Device;Manual techniques    PT Next Visit Plan progress hamstring mobility, improved hip/knee strength/balance.    [facility]   Consulted and Agree with Plan of Care Patient           Patient will benefit from skilled therapeutic intervention in order to improve the following deficits and impairments:  Decreased endurance, Decreased activity tolerance, Decreased strength, Pain, Difficulty walking, Decreased balance, Impaired perceived functional ability, Impaired flexibility  Visit Diagnosis: Chronic pain of left knee  Muscle weakness (generalized)  Difficulty in walking, not elsewhere classified     Problem List Patient Active Problem List   Diagnosis Date Noted  . NEPHROLITHIASIS, HX OF 11/28/2006    MScot Jun PT, DPT, OCS, ATC 11/13/19  11:28 AM  PHYSICAL THERAPY DISCHARGE SUMMARY  Visits from Start of Care: 2  Current functional level related to goals / functional outcomes: See note   Remaining deficits: See note   Education / Equipment: HEP Plan: Patient agrees to discharge.  Patient goals were partially met. Patient is being discharged due to not returning since the last visit.  ?????     MScot Jun PT, DPT, OCS, ATC 02/03/20  1:49 PM    [facility] Therapy [address] StreetGCorwin N[place] [address]Phone: [phone]  Fax:  [phone] Name: Sean JOLLIFFMRN: [id number] of Birth: [birth year]

## 2019-12-01 ENCOUNTER — Encounter: Payer: PPO | Admitting: Physical Therapy

## 2019-12-03 ENCOUNTER — Ambulatory Visit: Payer: PPO | Admitting: Orthopaedic Surgery

## 2020-01-18 DIAGNOSIS — L814 Other melanin hyperpigmentation: Secondary | ICD-10-CM | POA: Diagnosis not present

## 2020-01-18 DIAGNOSIS — L570 Actinic keratosis: Secondary | ICD-10-CM | POA: Diagnosis not present

## 2020-01-18 DIAGNOSIS — Z85828 Personal history of other malignant neoplasm of skin: Secondary | ICD-10-CM | POA: Diagnosis not present

## 2020-01-18 DIAGNOSIS — Z85820 Personal history of malignant melanoma of skin: Secondary | ICD-10-CM | POA: Diagnosis not present

## 2020-02-08 DIAGNOSIS — Z125 Encounter for screening for malignant neoplasm of prostate: Secondary | ICD-10-CM | POA: Diagnosis not present

## 2020-02-08 DIAGNOSIS — Z0000 Encounter for general adult medical examination without abnormal findings: Secondary | ICD-10-CM | POA: Diagnosis not present

## 2020-02-08 DIAGNOSIS — E7800 Pure hypercholesterolemia, unspecified: Secondary | ICD-10-CM | POA: Diagnosis not present

## 2020-02-08 DIAGNOSIS — C4491 Basal cell carcinoma of skin, unspecified: Secondary | ICD-10-CM | POA: Diagnosis not present

## 2020-02-08 DIAGNOSIS — Z86010 Personal history of colonic polyps: Secondary | ICD-10-CM | POA: Diagnosis not present

## 2020-02-08 DIAGNOSIS — G479 Sleep disorder, unspecified: Secondary | ICD-10-CM | POA: Diagnosis not present

## 2020-02-08 DIAGNOSIS — Z1389 Encounter for screening for other disorder: Secondary | ICD-10-CM | POA: Diagnosis not present

## 2020-02-08 DIAGNOSIS — M1990 Unspecified osteoarthritis, unspecified site: Secondary | ICD-10-CM | POA: Diagnosis not present

## 2020-02-08 DIAGNOSIS — Z8042 Family history of malignant neoplasm of prostate: Secondary | ICD-10-CM | POA: Diagnosis not present

## 2020-06-04 IMAGING — MR MR KNEE*L* W/O CM
4 of 6 series · 22 of 40 positions shown · non-contrast
Comparison: None.

CLINICAL DATA: Left knee pain for 3 months.

EXAM:
MRI OF THE LEFT KNEE WITHOUT CONTRAST
TECHNIQUE: Multiplanar, multisequence MR imaging of the knee was performed. No
intravenous contrast was administered.

[Series 3: T2 fat-sat · axial · 4.0mm · 0.50mm/px · z∈[-78,+41]mm · 5 of 30 slices shown (1 of 2)]
[im 1/30]
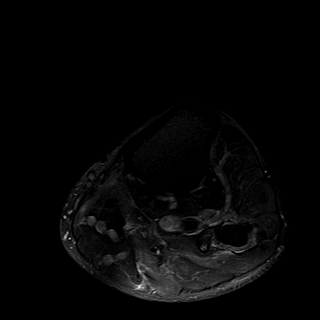
[im 5/30]
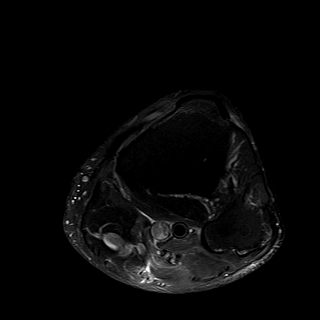
[im 10/30]
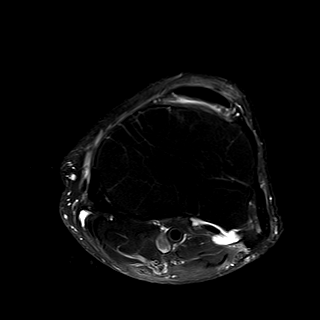
[im 15/30]
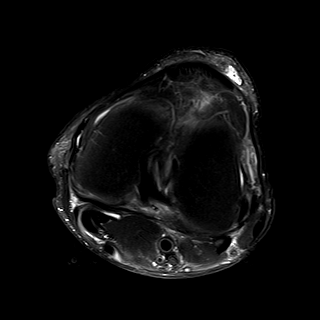
[im 25/30]
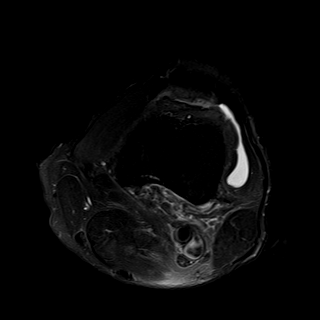

[Series 5: T2 fat-sat · coronal · 4.0mm · 0.31mm/px · 3 of 24 slices shown (2 of 2)]
[im 5/24]
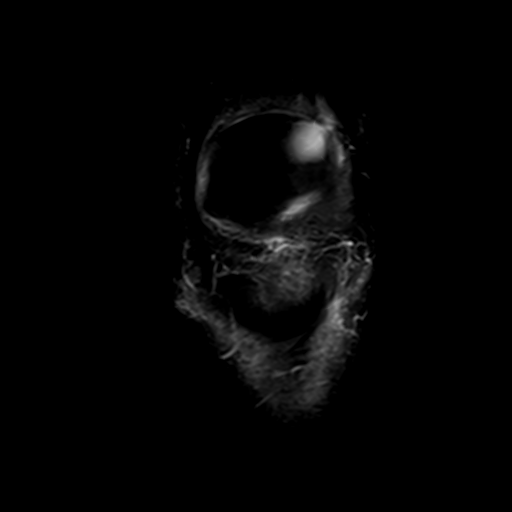
[im 14/24]
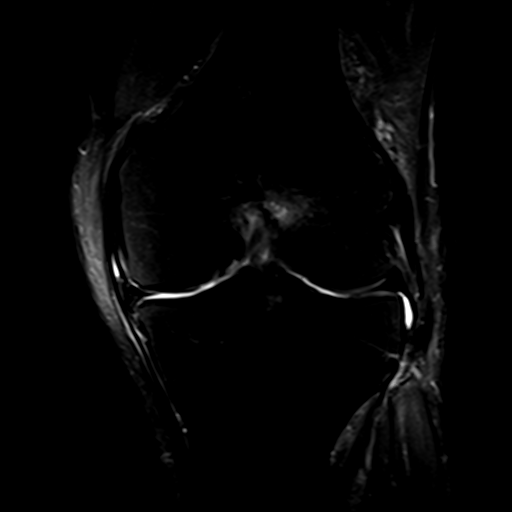
[im 24/24]
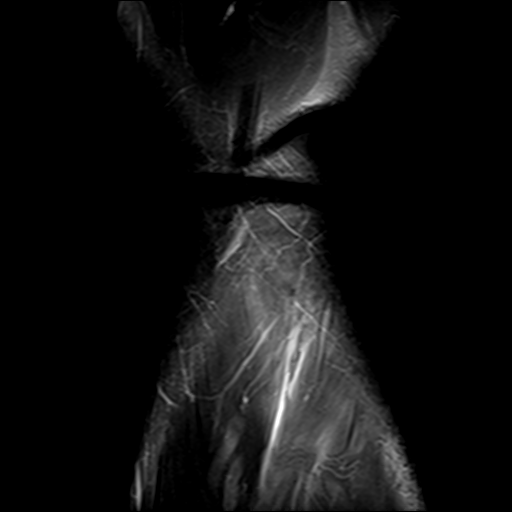

[Series 6: PD fat-sat · coronal · 3.0mm · 0.31mm/px · 7 of 30 slices shown (1 of 2)]
[im 1/30]
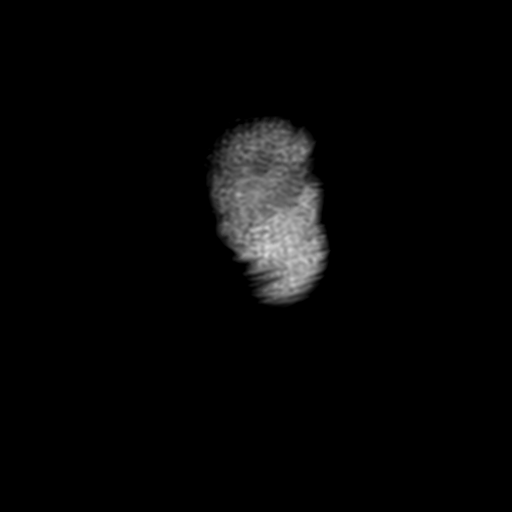
[im 5/30]
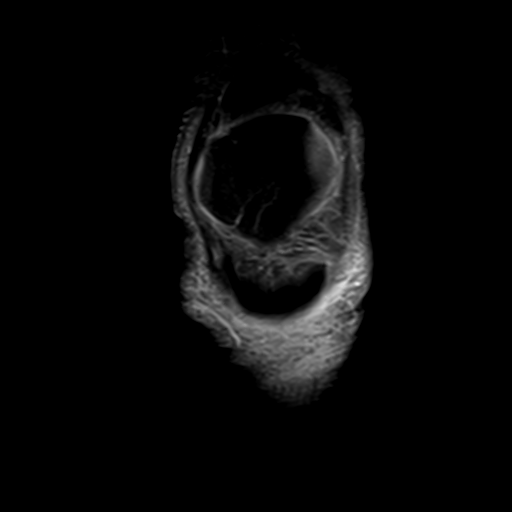
[im 10/30]
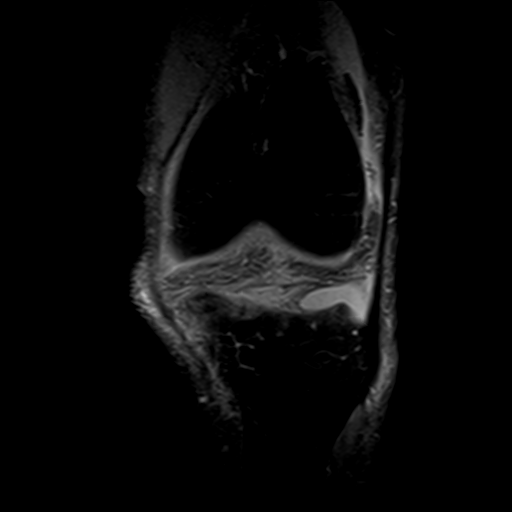
[im 15/30]
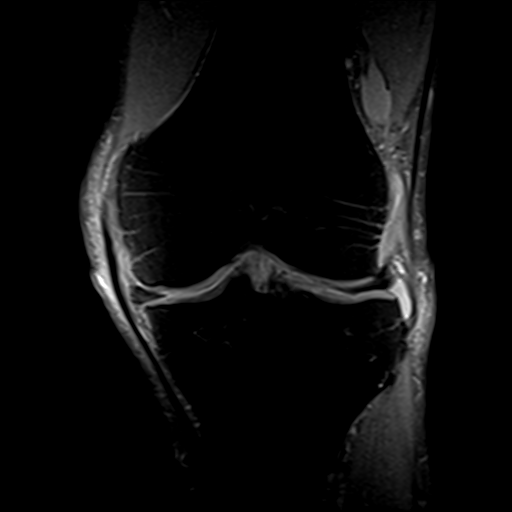
[im 20/30]
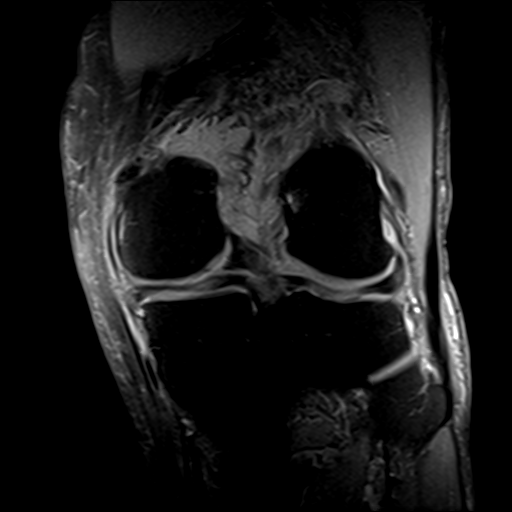
[im 25/30]
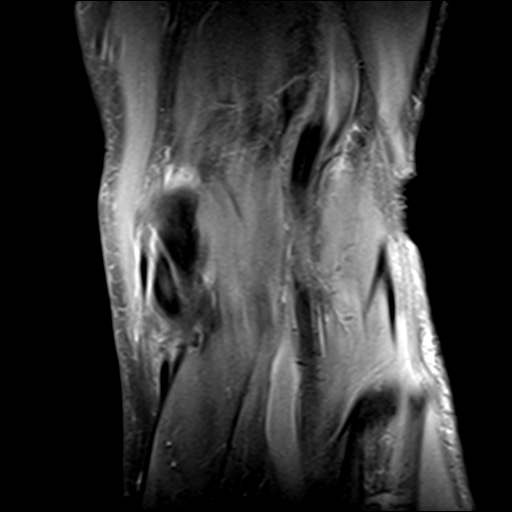
[im 30/30]
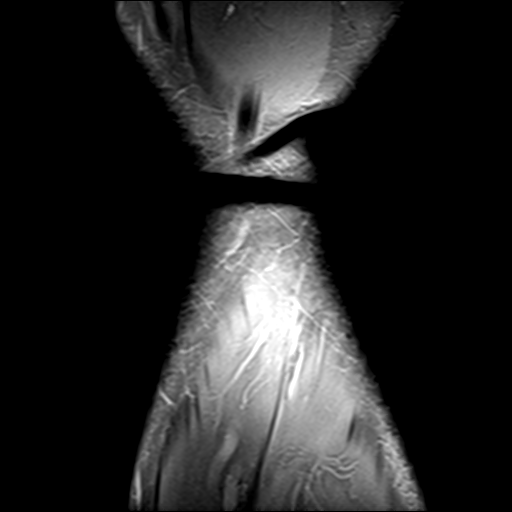

[Series 8: PD fat-sat · sagittal · 3.0mm · 0.31mm/px · 7 of 32 slices shown (2 of 2)]
[im 1/32]
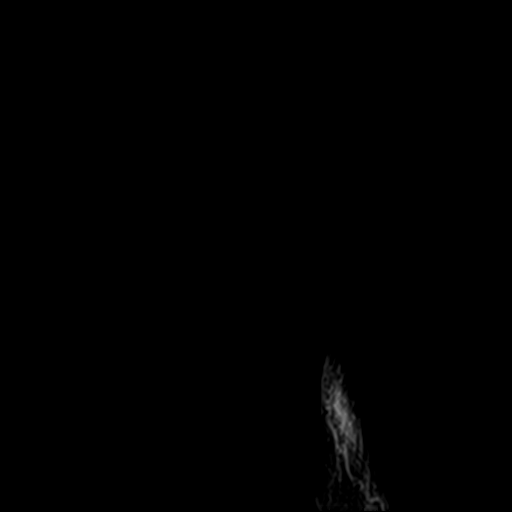
[im 6/32]
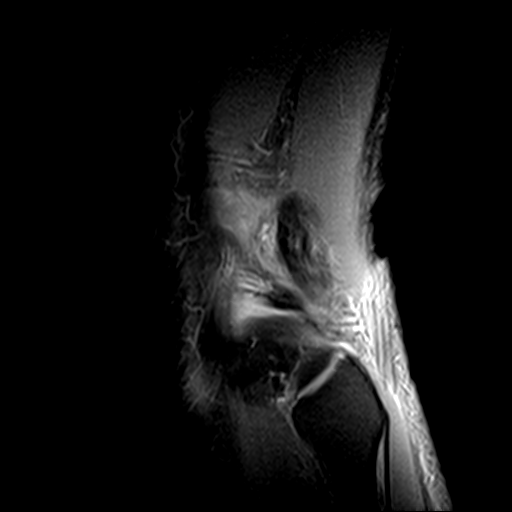
[im 11/32]
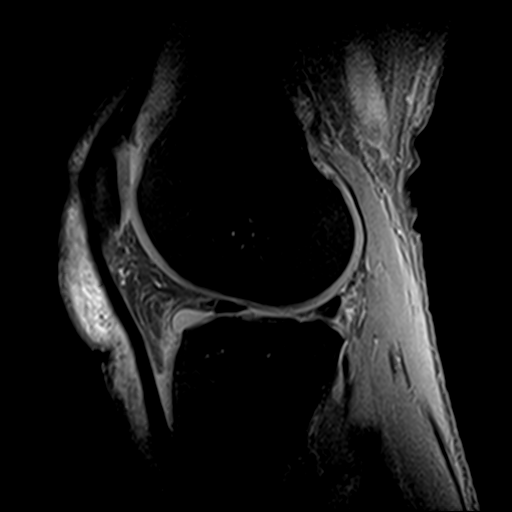
[im 16/32]
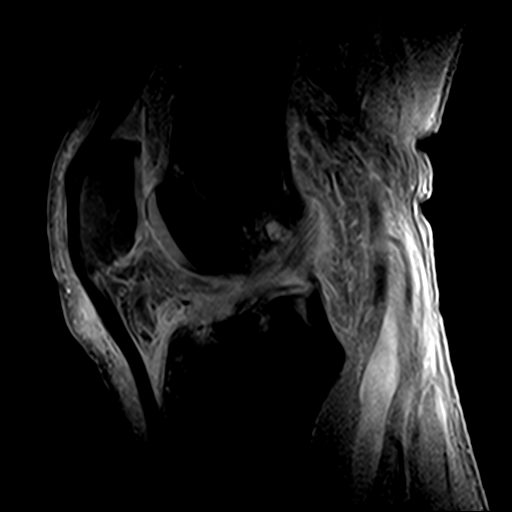
[im 21/32]
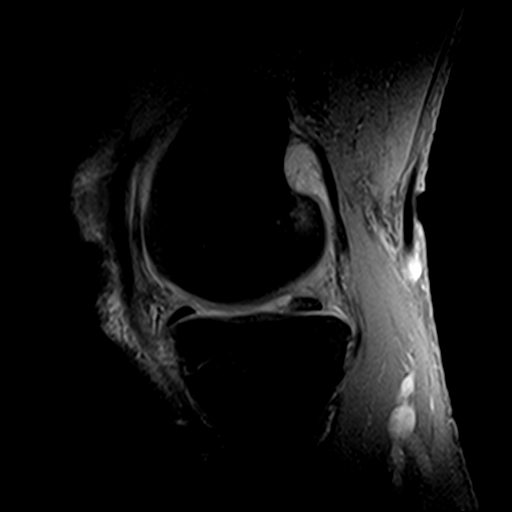
[im 26/32]
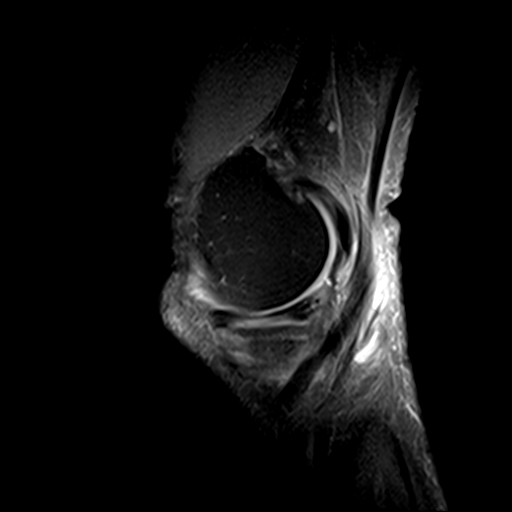
[im 32/32]
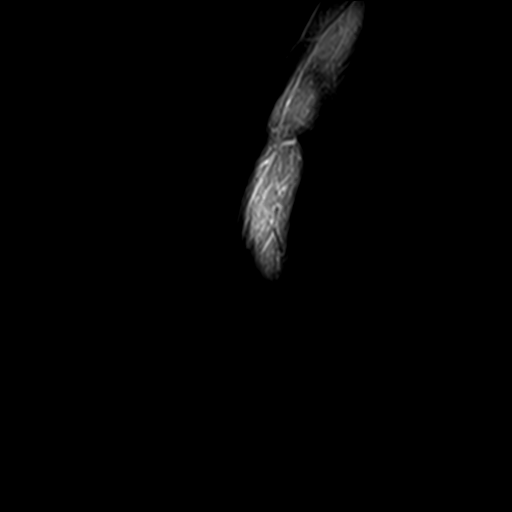

[22 of 40 positions shown; findings below may reference images not displayed]

FINDINGS: MENISCI

Medial meniscus: Oblique coursing inferior articular surface tear
involving the posterior horn extending into the mid body region.

Lateral meniscus: Free edge and inferior articular surface tears
involving the posterior horn.

LIGAMENTS

Cruciates: Advanced mucoid degeneration of the ACL. The PCL is
intact.

Collaterals:  Intact.  MCL and pes anserine bursitis.

CARTILAGE

Patellofemoral:  Mild degenerative chondrosis.

Medial: Moderate degenerative chondrosis with areas of moderate
cartilage thinning. There is also early joint space narrowing and
spurring.

Lateral:  Mild degenerative chondrosis with early spurring.

Joint: Small joint effusion and mild synovitis. There is moderate
edema like signal changes/inflammation noted in Hoffa's fat. A
medial patellar plica is noted.

Popliteal Fossa: Small Baker's cyst. Moderate bursitis noted near
the medial gastroc attachment on the femur.

Extensor Mechanism: The patella retinacular structures are intact
and the quadriceps and patellar tendons are intact.

Bones:  No acute bony findings.

Other: Normal knee musculature.
IMPRESSION: 1. Medial and lateral meniscal tears.
2. Advanced mucoid degeneration of the ACL but no tear.
3. Tricompartmental degenerative chondrosis, most significant in the
medial compartment.
4. Small joint effusion and mild synovitis. Moderate inflammation of
Hoffa's fat.
5. Moderate bursitis noted near the medial gastroc attachment.

## 2020-07-18 DIAGNOSIS — C44629 Squamous cell carcinoma of skin of left upper limb, including shoulder: Secondary | ICD-10-CM | POA: Diagnosis not present

## 2020-07-18 DIAGNOSIS — D0461 Carcinoma in situ of skin of right upper limb, including shoulder: Secondary | ICD-10-CM | POA: Diagnosis not present

## 2020-07-18 DIAGNOSIS — D485 Neoplasm of uncertain behavior of skin: Secondary | ICD-10-CM | POA: Diagnosis not present

## 2020-07-18 DIAGNOSIS — Z85820 Personal history of malignant melanoma of skin: Secondary | ICD-10-CM | POA: Diagnosis not present

## 2020-07-18 DIAGNOSIS — L821 Other seborrheic keratosis: Secondary | ICD-10-CM | POA: Diagnosis not present

## 2020-07-18 DIAGNOSIS — D0462 Carcinoma in situ of skin of left upper limb, including shoulder: Secondary | ICD-10-CM | POA: Diagnosis not present

## 2020-07-18 DIAGNOSIS — Z85828 Personal history of other malignant neoplasm of skin: Secondary | ICD-10-CM | POA: Diagnosis not present

## 2020-07-18 DIAGNOSIS — L570 Actinic keratosis: Secondary | ICD-10-CM | POA: Diagnosis not present

## 2021-01-11 DIAGNOSIS — L570 Actinic keratosis: Secondary | ICD-10-CM | POA: Diagnosis not present

## 2021-01-11 DIAGNOSIS — C44729 Squamous cell carcinoma of skin of left lower limb, including hip: Secondary | ICD-10-CM | POA: Diagnosis not present

## 2021-01-11 DIAGNOSIS — Z85820 Personal history of malignant melanoma of skin: Secondary | ICD-10-CM | POA: Diagnosis not present

## 2021-01-11 DIAGNOSIS — C44629 Squamous cell carcinoma of skin of left upper limb, including shoulder: Secondary | ICD-10-CM | POA: Diagnosis not present

## 2021-01-11 DIAGNOSIS — L821 Other seborrheic keratosis: Secondary | ICD-10-CM | POA: Diagnosis not present

## 2021-01-11 DIAGNOSIS — D485 Neoplasm of uncertain behavior of skin: Secondary | ICD-10-CM | POA: Diagnosis not present

## 2021-01-11 DIAGNOSIS — Z85828 Personal history of other malignant neoplasm of skin: Secondary | ICD-10-CM | POA: Diagnosis not present

## 2021-01-17 DIAGNOSIS — S62617A Displaced fracture of proximal phalanx of left little finger, initial encounter for closed fracture: Secondary | ICD-10-CM | POA: Diagnosis not present

## 2021-01-17 DIAGNOSIS — M19042 Primary osteoarthritis, left hand: Secondary | ICD-10-CM | POA: Diagnosis not present

## 2021-01-17 DIAGNOSIS — M79644 Pain in right finger(s): Secondary | ICD-10-CM | POA: Diagnosis not present

## 2021-01-17 DIAGNOSIS — M79642 Pain in left hand: Secondary | ICD-10-CM | POA: Diagnosis not present

## 2021-01-17 DIAGNOSIS — M19041 Primary osteoarthritis, right hand: Secondary | ICD-10-CM | POA: Diagnosis not present

## 2021-01-17 DIAGNOSIS — M25641 Stiffness of right hand, not elsewhere classified: Secondary | ICD-10-CM | POA: Diagnosis not present

## 2021-01-17 DIAGNOSIS — M79641 Pain in right hand: Secondary | ICD-10-CM | POA: Diagnosis not present

## 2021-02-07 DIAGNOSIS — S62617D Displaced fracture of proximal phalanx of left little finger, subsequent encounter for fracture with routine healing: Secondary | ICD-10-CM | POA: Diagnosis not present

## 2021-02-07 DIAGNOSIS — M19041 Primary osteoarthritis, right hand: Secondary | ICD-10-CM | POA: Diagnosis not present

## 2021-02-07 DIAGNOSIS — M19042 Primary osteoarthritis, left hand: Secondary | ICD-10-CM | POA: Diagnosis not present

## 2021-03-01 DIAGNOSIS — S62617D Displaced fracture of proximal phalanx of left little finger, subsequent encounter for fracture with routine healing: Secondary | ICD-10-CM | POA: Diagnosis not present

## 2021-03-01 DIAGNOSIS — M25641 Stiffness of right hand, not elsewhere classified: Secondary | ICD-10-CM | POA: Diagnosis not present

## 2021-03-01 DIAGNOSIS — M79644 Pain in right finger(s): Secondary | ICD-10-CM | POA: Diagnosis not present

## 2021-03-01 DIAGNOSIS — S62617A Displaced fracture of proximal phalanx of left little finger, initial encounter for closed fracture: Secondary | ICD-10-CM | POA: Diagnosis not present

## 2021-03-01 DIAGNOSIS — M19041 Primary osteoarthritis, right hand: Secondary | ICD-10-CM | POA: Diagnosis not present

## 2021-03-01 DIAGNOSIS — M19042 Primary osteoarthritis, left hand: Secondary | ICD-10-CM | POA: Diagnosis not present

## 2021-03-23 DIAGNOSIS — M1990 Unspecified osteoarthritis, unspecified site: Secondary | ICD-10-CM | POA: Diagnosis not present

## 2021-03-23 DIAGNOSIS — Z1389 Encounter for screening for other disorder: Secondary | ICD-10-CM | POA: Diagnosis not present

## 2021-03-23 DIAGNOSIS — K219 Gastro-esophageal reflux disease without esophagitis: Secondary | ICD-10-CM | POA: Diagnosis not present

## 2021-03-23 DIAGNOSIS — R7303 Prediabetes: Secondary | ICD-10-CM | POA: Diagnosis not present

## 2021-03-23 DIAGNOSIS — E7800 Pure hypercholesterolemia, unspecified: Secondary | ICD-10-CM | POA: Diagnosis not present

## 2021-03-23 DIAGNOSIS — Z125 Encounter for screening for malignant neoplasm of prostate: Secondary | ICD-10-CM | POA: Diagnosis not present

## 2021-03-23 DIAGNOSIS — N529 Male erectile dysfunction, unspecified: Secondary | ICD-10-CM | POA: Diagnosis not present

## 2021-03-23 DIAGNOSIS — C4490 Unspecified malignant neoplasm of skin, unspecified: Secondary | ICD-10-CM | POA: Diagnosis not present

## 2021-03-23 DIAGNOSIS — Z0000 Encounter for general adult medical examination without abnormal findings: Secondary | ICD-10-CM | POA: Diagnosis not present

## 2021-06-12 DIAGNOSIS — D0462 Carcinoma in situ of skin of left upper limb, including shoulder: Secondary | ICD-10-CM | POA: Diagnosis not present

## 2021-06-12 DIAGNOSIS — L570 Actinic keratosis: Secondary | ICD-10-CM | POA: Diagnosis not present

## 2021-06-12 DIAGNOSIS — Z85820 Personal history of malignant melanoma of skin: Secondary | ICD-10-CM | POA: Diagnosis not present

## 2021-06-12 DIAGNOSIS — L821 Other seborrheic keratosis: Secondary | ICD-10-CM | POA: Diagnosis not present

## 2021-06-12 DIAGNOSIS — D044 Carcinoma in situ of skin of scalp and neck: Secondary | ICD-10-CM | POA: Diagnosis not present

## 2021-06-12 DIAGNOSIS — D485 Neoplasm of uncertain behavior of skin: Secondary | ICD-10-CM | POA: Diagnosis not present

## 2021-06-12 DIAGNOSIS — Z85828 Personal history of other malignant neoplasm of skin: Secondary | ICD-10-CM | POA: Diagnosis not present

## 2021-07-10 DIAGNOSIS — H532 Diplopia: Secondary | ICD-10-CM | POA: Diagnosis not present

## 2021-07-10 DIAGNOSIS — L723 Sebaceous cyst: Secondary | ICD-10-CM | POA: Diagnosis not present

## 2021-08-08 DIAGNOSIS — H532 Diplopia: Secondary | ICD-10-CM | POA: Diagnosis not present

## 2021-08-21 DIAGNOSIS — R079 Chest pain, unspecified: Secondary | ICD-10-CM | POA: Diagnosis not present

## 2021-11-03 DIAGNOSIS — W57XXXA Bitten or stung by nonvenomous insect and other nonvenomous arthropods, initial encounter: Secondary | ICD-10-CM | POA: Diagnosis not present

## 2021-11-03 DIAGNOSIS — S40262A Insect bite (nonvenomous) of left shoulder, initial encounter: Secondary | ICD-10-CM | POA: Diagnosis not present

## 2021-11-03 DIAGNOSIS — R238 Other skin changes: Secondary | ICD-10-CM | POA: Diagnosis not present

## 2021-11-03 DIAGNOSIS — A6920 Lyme disease, unspecified: Secondary | ICD-10-CM | POA: Diagnosis not present

## 2021-12-13 DIAGNOSIS — L821 Other seborrheic keratosis: Secondary | ICD-10-CM | POA: Diagnosis not present

## 2021-12-13 DIAGNOSIS — L570 Actinic keratosis: Secondary | ICD-10-CM | POA: Diagnosis not present

## 2021-12-13 DIAGNOSIS — Z85820 Personal history of malignant melanoma of skin: Secondary | ICD-10-CM | POA: Diagnosis not present

## 2021-12-13 DIAGNOSIS — Z85828 Personal history of other malignant neoplasm of skin: Secondary | ICD-10-CM | POA: Diagnosis not present

## 2022-03-26 ENCOUNTER — Other Ambulatory Visit: Payer: Self-pay | Admitting: Internal Medicine

## 2022-03-26 DIAGNOSIS — Z125 Encounter for screening for malignant neoplasm of prostate: Secondary | ICD-10-CM | POA: Diagnosis not present

## 2022-03-26 DIAGNOSIS — Z131 Encounter for screening for diabetes mellitus: Secondary | ICD-10-CM | POA: Diagnosis not present

## 2022-03-26 DIAGNOSIS — E7800 Pure hypercholesterolemia, unspecified: Secondary | ICD-10-CM | POA: Diagnosis not present

## 2022-03-26 DIAGNOSIS — Z0000 Encounter for general adult medical examination without abnormal findings: Secondary | ICD-10-CM | POA: Diagnosis not present

## 2022-03-26 DIAGNOSIS — R1319 Other dysphagia: Secondary | ICD-10-CM

## 2022-04-10 ENCOUNTER — Other Ambulatory Visit: Payer: PPO

## 2022-04-13 ENCOUNTER — Ambulatory Visit
Admission: RE | Admit: 2022-04-13 | Discharge: 2022-04-13 | Disposition: A | Discharge status: Home / Self Care | Payer: PPO | Source: Ambulatory Visit | Attending: Internal Medicine | Admitting: Internal Medicine

## 2022-04-13 DIAGNOSIS — R1319 Other dysphagia: Secondary | ICD-10-CM

## 2023-04-06 DIAGNOSIS — Z8249 Family history of ischemic heart disease and other diseases of the circulatory system: Secondary | ICD-10-CM | POA: Diagnosis not present

## 2023-04-06 DIAGNOSIS — Z809 Family history of malignant neoplasm, unspecified: Secondary | ICD-10-CM | POA: Diagnosis not present

## 2023-04-06 DIAGNOSIS — Z85820 Personal history of malignant melanoma of skin: Secondary | ICD-10-CM | POA: Diagnosis not present

## 2023-04-06 DIAGNOSIS — Z823 Family history of stroke: Secondary | ICD-10-CM | POA: Diagnosis not present

## 2023-04-06 DIAGNOSIS — R030 Elevated blood-pressure reading, without diagnosis of hypertension: Secondary | ICD-10-CM | POA: Diagnosis not present

## 2023-04-06 DIAGNOSIS — M1990 Unspecified osteoarthritis, unspecified site: Secondary | ICD-10-CM | POA: Diagnosis not present

## 2023-04-06 DIAGNOSIS — E785 Hyperlipidemia, unspecified: Secondary | ICD-10-CM | POA: Diagnosis not present

## 2023-06-18 DIAGNOSIS — L905 Scar conditions and fibrosis of skin: Secondary | ICD-10-CM | POA: Diagnosis not present

## 2023-06-18 DIAGNOSIS — L570 Actinic keratosis: Secondary | ICD-10-CM | POA: Diagnosis not present

## 2023-06-18 DIAGNOSIS — Z85828 Personal history of other malignant neoplasm of skin: Secondary | ICD-10-CM | POA: Diagnosis not present

## 2023-06-18 DIAGNOSIS — L821 Other seborrheic keratosis: Secondary | ICD-10-CM | POA: Diagnosis not present

## 2023-06-18 DIAGNOSIS — Z85820 Personal history of malignant melanoma of skin: Secondary | ICD-10-CM | POA: Diagnosis not present

## 2023-09-12 DIAGNOSIS — E7800 Pure hypercholesterolemia, unspecified: Secondary | ICD-10-CM | POA: Diagnosis not present

## 2023-09-12 DIAGNOSIS — R5383 Other fatigue: Secondary | ICD-10-CM | POA: Diagnosis not present

## 2023-09-12 DIAGNOSIS — R519 Headache, unspecified: Secondary | ICD-10-CM | POA: Diagnosis not present

## 2023-09-12 DIAGNOSIS — R059 Cough, unspecified: Secondary | ICD-10-CM | POA: Diagnosis not present

## 2023-09-16 DIAGNOSIS — E785 Hyperlipidemia, unspecified: Secondary | ICD-10-CM | POA: Diagnosis not present

## 2023-09-16 DIAGNOSIS — Z880 Allergy status to penicillin: Secondary | ICD-10-CM | POA: Diagnosis not present

## 2023-09-16 DIAGNOSIS — Z79891 Long term (current) use of opiate analgesic: Secondary | ICD-10-CM | POA: Diagnosis not present

## 2023-09-16 DIAGNOSIS — Z79899 Other long term (current) drug therapy: Secondary | ICD-10-CM | POA: Diagnosis not present

## 2023-09-16 DIAGNOSIS — R1032 Left lower quadrant pain: Secondary | ICD-10-CM | POA: Diagnosis not present

## 2023-09-16 DIAGNOSIS — Z87442 Personal history of urinary calculi: Secondary | ICD-10-CM | POA: Diagnosis not present

## 2023-09-16 DIAGNOSIS — Z87891 Personal history of nicotine dependence: Secondary | ICD-10-CM | POA: Diagnosis not present

## 2023-09-16 DIAGNOSIS — I10 Essential (primary) hypertension: Secondary | ICD-10-CM | POA: Diagnosis not present

## 2023-09-16 DIAGNOSIS — N135 Crossing vessel and stricture of ureter without hydronephrosis: Secondary | ICD-10-CM | POA: Diagnosis not present

## 2023-09-16 DIAGNOSIS — N132 Hydronephrosis with renal and ureteral calculous obstruction: Secondary | ICD-10-CM | POA: Diagnosis not present

## 2023-09-16 DIAGNOSIS — N201 Calculus of ureter: Secondary | ICD-10-CM | POA: Diagnosis not present

## 2023-09-24 ENCOUNTER — Ambulatory Visit
Admission: RE | Admit: 2023-09-24 | Discharge: 2023-09-24 | Disposition: A | Discharge status: Home / Self Care | Source: Ambulatory Visit | Attending: Physician Assistant | Admitting: Physician Assistant

## 2023-09-24 ENCOUNTER — Other Ambulatory Visit: Payer: Self-pay | Admitting: Physician Assistant

## 2023-09-24 DIAGNOSIS — N201 Calculus of ureter: Secondary | ICD-10-CM | POA: Diagnosis not present

## 2023-09-24 DIAGNOSIS — N1330 Unspecified hydronephrosis: Secondary | ICD-10-CM | POA: Diagnosis not present

## 2023-09-24 DIAGNOSIS — R059 Cough, unspecified: Secondary | ICD-10-CM

## 2023-09-24 DIAGNOSIS — R300 Dysuria: Secondary | ICD-10-CM | POA: Diagnosis not present

## 2023-09-24 DIAGNOSIS — N200 Calculus of kidney: Secondary | ICD-10-CM | POA: Diagnosis not present

## 2023-10-01 DIAGNOSIS — N201 Calculus of ureter: Secondary | ICD-10-CM | POA: Diagnosis not present

## 2023-10-12 DIAGNOSIS — S51831A Puncture wound without foreign body of right forearm, initial encounter: Secondary | ICD-10-CM | POA: Diagnosis not present

## 2023-10-12 DIAGNOSIS — S59911A Unspecified injury of right forearm, initial encounter: Secondary | ICD-10-CM | POA: Diagnosis not present

## 2023-10-12 DIAGNOSIS — M799 Soft tissue disorder, unspecified: Secondary | ICD-10-CM | POA: Diagnosis not present

## 2023-10-22 DIAGNOSIS — S51831A Puncture wound without foreign body of right forearm, initial encounter: Secondary | ICD-10-CM | POA: Diagnosis not present

## 2023-11-01 DIAGNOSIS — H532 Diplopia: Secondary | ICD-10-CM | POA: Diagnosis not present

## 2023-11-07 DIAGNOSIS — M19031 Primary osteoarthritis, right wrist: Secondary | ICD-10-CM | POA: Diagnosis not present

## 2023-11-07 DIAGNOSIS — S51831A Puncture wound without foreign body of right forearm, initial encounter: Secondary | ICD-10-CM | POA: Diagnosis not present

## 2023-11-15 DIAGNOSIS — N1330 Unspecified hydronephrosis: Secondary | ICD-10-CM | POA: Diagnosis not present

## 2023-11-15 DIAGNOSIS — E7800 Pure hypercholesterolemia, unspecified: Secondary | ICD-10-CM | POA: Diagnosis not present

## 2023-11-15 DIAGNOSIS — N200 Calculus of kidney: Secondary | ICD-10-CM | POA: Diagnosis not present

## 2023-11-26 DIAGNOSIS — S51831D Puncture wound without foreign body of right forearm, subsequent encounter: Secondary | ICD-10-CM | POA: Diagnosis not present

## 2023-11-29 DIAGNOSIS — I10 Essential (primary) hypertension: Secondary | ICD-10-CM | POA: Diagnosis not present

## 2023-11-29 DIAGNOSIS — Z86018 Personal history of other benign neoplasm: Secondary | ICD-10-CM | POA: Diagnosis not present

## 2023-12-18 DIAGNOSIS — L905 Scar conditions and fibrosis of skin: Secondary | ICD-10-CM | POA: Diagnosis not present

## 2023-12-18 DIAGNOSIS — Z85828 Personal history of other malignant neoplasm of skin: Secondary | ICD-10-CM | POA: Diagnosis not present

## 2023-12-18 DIAGNOSIS — C44629 Squamous cell carcinoma of skin of left upper limb, including shoulder: Secondary | ICD-10-CM | POA: Diagnosis not present

## 2023-12-18 DIAGNOSIS — D485 Neoplasm of uncertain behavior of skin: Secondary | ICD-10-CM | POA: Diagnosis not present

## 2023-12-18 DIAGNOSIS — L570 Actinic keratosis: Secondary | ICD-10-CM | POA: Diagnosis not present

## 2023-12-18 DIAGNOSIS — D0462 Carcinoma in situ of skin of left upper limb, including shoulder: Secondary | ICD-10-CM | POA: Diagnosis not present

## 2023-12-18 DIAGNOSIS — Z85820 Personal history of malignant melanoma of skin: Secondary | ICD-10-CM | POA: Diagnosis not present
